# Patient Record
Sex: Male | Born: 1935 | Race: White | Hispanic: No | Marital: Single | Smoking: Former smoker
Health system: Southern US, Community
[De-identification: ages and names within clinical notes are randomized; demographics above are authoritative.]

## PROBLEM LIST (undated history)

## (undated) DIAGNOSIS — F419 Anxiety disorder, unspecified: Secondary | ICD-10-CM

## (undated) DIAGNOSIS — I10 Essential (primary) hypertension: Secondary | ICD-10-CM

## (undated) DIAGNOSIS — K219 Gastro-esophageal reflux disease without esophagitis: Secondary | ICD-10-CM

## (undated) DIAGNOSIS — I509 Heart failure, unspecified: Secondary | ICD-10-CM

## (undated) DIAGNOSIS — R0902 Hypoxemia: Secondary | ICD-10-CM

## (undated) DIAGNOSIS — F1020 Alcohol dependence, uncomplicated: Secondary | ICD-10-CM

## (undated) DIAGNOSIS — N400 Enlarged prostate without lower urinary tract symptoms: Secondary | ICD-10-CM

## (undated) DIAGNOSIS — Z950 Presence of cardiac pacemaker: Secondary | ICD-10-CM

## (undated) DIAGNOSIS — E7800 Pure hypercholesterolemia, unspecified: Secondary | ICD-10-CM

## (undated) DIAGNOSIS — F329 Major depressive disorder, single episode, unspecified: Secondary | ICD-10-CM

## (undated) DIAGNOSIS — I4430 Unspecified atrioventricular block: Secondary | ICD-10-CM

## (undated) DIAGNOSIS — F32A Depression, unspecified: Secondary | ICD-10-CM

## (undated) HISTORY — DX: Alcohol dependence, uncomplicated: F10.20

## (undated) HISTORY — PX: INSERT / REPLACE / REMOVE PACEMAKER: SUR710

## (undated) HISTORY — PX: CHOLECYSTECTOMY: SHX55

## (undated) HISTORY — PX: CATARACT EXTRACTION, BILATERAL: SHX1313

---

## 2016-11-27 ENCOUNTER — Emergency Department (HOSPITAL_COMMUNITY): Payer: Medicare Other

## 2016-11-27 ENCOUNTER — Encounter (HOSPITAL_COMMUNITY): Payer: Self-pay | Admitting: Emergency Medicine

## 2016-11-27 ENCOUNTER — Emergency Department (HOSPITAL_COMMUNITY)
Admission: EM | Admit: 2016-11-27 | Discharge: 2016-11-27 | Disposition: A | Discharge status: Home / Self Care | Payer: Medicare Other | Attending: Emergency Medicine | Admitting: Emergency Medicine

## 2016-11-27 DIAGNOSIS — W06XXXA Fall from bed, initial encounter: Secondary | ICD-10-CM | POA: Insufficient documentation

## 2016-11-27 DIAGNOSIS — Y999 Unspecified external cause status: Secondary | ICD-10-CM | POA: Diagnosis not present

## 2016-11-27 DIAGNOSIS — Z950 Presence of cardiac pacemaker: Secondary | ICD-10-CM | POA: Insufficient documentation

## 2016-11-27 DIAGNOSIS — Y929 Unspecified place or not applicable: Secondary | ICD-10-CM | POA: Diagnosis not present

## 2016-11-27 DIAGNOSIS — I110 Hypertensive heart disease with heart failure: Secondary | ICD-10-CM | POA: Diagnosis not present

## 2016-11-27 DIAGNOSIS — R0902 Hypoxemia: Secondary | ICD-10-CM | POA: Diagnosis not present

## 2016-11-27 DIAGNOSIS — Y939 Activity, unspecified: Secondary | ICD-10-CM | POA: Diagnosis not present

## 2016-11-27 DIAGNOSIS — I509 Heart failure, unspecified: Secondary | ICD-10-CM | POA: Diagnosis not present

## 2016-11-27 DIAGNOSIS — S0990XA Unspecified injury of head, initial encounter: Secondary | ICD-10-CM | POA: Diagnosis present

## 2016-11-27 DIAGNOSIS — Z87891 Personal history of nicotine dependence: Secondary | ICD-10-CM | POA: Insufficient documentation

## 2016-11-27 DIAGNOSIS — W19XXXA Unspecified fall, initial encounter: Secondary | ICD-10-CM

## 2016-11-27 HISTORY — DX: Hypoxemia: R09.02

## 2016-11-27 HISTORY — DX: Unspecified atrioventricular block: I44.30

## 2016-11-27 HISTORY — DX: Benign prostatic hyperplasia without lower urinary tract symptoms: N40.0

## 2016-11-27 HISTORY — DX: Pure hypercholesterolemia, unspecified: E78.00

## 2016-11-27 HISTORY — DX: Presence of cardiac pacemaker: Z95.0

## 2016-11-27 HISTORY — DX: Heart failure, unspecified: I50.9

## 2016-11-27 HISTORY — DX: Anxiety disorder, unspecified: F41.9

## 2016-11-27 HISTORY — DX: Gastro-esophageal reflux disease without esophagitis: K21.9

## 2016-11-27 HISTORY — DX: Essential (primary) hypertension: I10

## 2016-11-27 HISTORY — DX: Depression, unspecified: F32.A

## 2016-11-27 HISTORY — DX: Major depressive disorder, single episode, unspecified: F32.9

## 2016-11-27 NOTE — ED Triage Notes (Addendum)
Per EMS pt from [facility] for evaluation of fall. EMS stated that pt had fell while sitting on side of bed and hit back of head and c/o neck pain. EMS placed pt in towel roll for comfort. Pt history from facility is unclear due to lack of records.Pt denies any LOC when hitting head. When pt placed on room air SPO2 sats at 87%.

## 2016-11-27 NOTE — ED Notes (Signed)
Pt discharged in care of family. Family aware that he can be transported without any oxygen due to pt wishes of no further respiratory care. O2 saturation on room air 92%

## 2016-11-27 NOTE — ED Provider Notes (Signed)
[facility]-EMERGENCY DEPT Provider Note   CSN: [id number] Arrival date & time: 11/27/16  0453  LEVEL 5 CAVEAT - DEMENTIA   History   Chief Complaint Chief Complaint  Patient presents with  . Fall    HPI Christopher Maffett Sr. is a 83 y.o. male.  HPI  83 year old male brought in from his nursing home after a fall and head injury. History taken from the patient but also daughter at the bedside. She states that he has had signs of dementia and just recently moved into this nursing facility. The patient tells me that he was trying to get his covers adjusted and when he pushed back he hit his head on the bed board. However the daughter and son-in-law state that he told the facility he fell out of bed. Complaining of occipital headache/neck pain. He fell last week while walking to go get the mail and still complains of facial pain and teeth pain. He was noted to be hypoxic to 87%. He used to be on oxygen last year after his [employer] pacemaker was placed but has not required oxygen since. Does not feel short of breath at this time but has exertional dyspnea chronically. He has a living will and has told his daughter in the past that he would not want oxygen chronically anymore. No leg swelling or chest pain. He has a history of heart failure.  Past Medical History:  Diagnosis Date  . Acid reflux   . Anxiety   . AV block   . BPH (benign prostatic hyperplasia)   . CHF (congestive heart failure) (HCC)   . Depression   . High cholesterol   . Hypertension   . Hypoxia   . Pacemaker     There are no active problems to display for this patient.   Past Surgical History:  Procedure Laterality Date  . CHOLECYSTECTOMY         Home Medications    Prior to Admission medications   Not on File    Family History History reviewed. No pertinent family history.  Social History Social History  Substance Use Topics  . Smoking status: Former Games developer  . Smokeless tobacco: Never Used  . Alcohol use  No     Allergies   Amoxicillin and Doxycycline   Review of Systems Review of Systems  Unable to perform ROS: Dementia     Physical Exam Updated Vital Signs BP 120/78   Pulse 62   Temp 97.5 F (36.4 C) (Oral)   Resp (!) 24   Ht 5\' 3"  (1.6 m)   Wt 150 lb (68 kg)   SpO2 94%   BMI 26.57 kg/m   Physical Exam  Constitutional: He is oriented to person, place, and time. He appears well-developed and well-nourished.  HENT:  Head: Normocephalic and atraumatic.  Right Ear: External ear normal.  Left Ear: External ear normal.  Nose: Nose normal.  No signs of trauma, mild occipital tenderness Mild bilateral mandible tenderness  Eyes: Right eye exhibits no discharge. Left eye exhibits no discharge.  Neck: Neck supple.  Neck in towel roll  Cardiovascular: Normal rate, regular rhythm and normal heart sounds.   Pulmonary/Chest: Effort normal and breath sounds normal.  Abdominal: Soft. There is no tenderness.  Musculoskeletal: He exhibits no edema.  Neurological: He is alert and oriented to person, place, and time.  Skin: Skin is warm and dry. He is not diaphoretic.  Nursing note and vitals reviewed.    ED Treatments / Results  Labs (all  labs ordered are listed, but only abnormal results are displayed) Labs Reviewed - No data to display  EKG  EKG Interpretation  Date/Time:  Thursday November 27 2016 05:16:49 EDT Ventricular Rate:  75 PR Interval:    QRS Duration: 128 QT Interval:  435 QTC Calculation: 483 R Axis:   -79 Text Interpretation:  Atrial-paced rhythm Nonspecific IVCD with LAD Anterolateral infarct, age indeterminate No old tracing to compare Confirmed by Lynnleigh Soden MD, Shunsuke Granzow [phone]([id number]) on 11/27/2016 6:15:41 AM       Radiology Dg Chest 2 View  Result Date: 11/27/2016 CLINICAL DATA:  83 year old male with hypoxia. EXAM: CHEST  2 VIEW COMPARISON:  None. FINDINGS: There is mild cardiomegaly with central vascular prominence likely mild congestive changes. There is  shallow inspiration with bibasilar atelectatic changes. Diffuse interstitial coarsening predominantly over the left mid to lower lung field, likely chronic. Superimposed pneumonia is not excluded. Clinical correlation is recommended. Left pectoral pacemaker device noted. There is osteopenia with degenerative changes of the spine. No acute osseous pathology. IMPRESSION: 1. Mild cardiomegaly with probable mild vascular congestion. 2. Diffuse interstitial and subpleural coarsening primarily over the left mid to lower lung field. These findings are likely chronic. Developing pneumonia is however is not excluded. Clinical correlation is recommended. Electronically Signed   By: Elgie CollardArash  Radparvar M.D.   On: 11/27/2016 06:12   Ct Head Wo Contrast  Result Date: 11/27/2016 CLINICAL DATA:  83 year old male with fall and trauma to the posterior head. EXAM: CT HEAD WITHOUT CONTRAST CT MAXILLOFACIAL WITHOUT CONTRAST CT CERVICAL SPINE WITHOUT CONTRAST TECHNIQUE: Multidetector CT imaging of the head, cervical spine, and maxillofacial structures were performed using the standard protocol without intravenous contrast. Multiplanar CT image reconstructions of the cervical spine and maxillofacial structures were also generated. COMPARISON:  None. FINDINGS: CT HEAD FINDINGS Brain: There is moderate age-related atrophy and chronic microvascular ischemic changes. There is no acute intracranial hemorrhage. No mass effect or midline shift noted. No intra-axial fluid collection. Vascular: No hyperdense vessel or unexpected calcification. Skull: Normal. Negative for fracture or focal lesion. Other: None. CT MAXILLOFACIAL FINDINGS Osseous: No fracture or mandibular dislocation. No destructive process. Orbits: The globes and retro-orbital fat are preserved. Bilateral cataract surgeries noted. Sinuses: Clear. Soft tissues: Negative. CT CERVICAL SPINE FINDINGS Alignment: No acute subluxation. Skull base and vertebrae: No acute fracture. No  primary bone lesion or focal pathologic process. Soft tissues and spinal canal: No prevertebral fluid or swelling. No visible canal hematoma. Disc levels: No acute findings. Mild degenerative disc disease. Multilevel facet hypertrophy most prominent at C6-C7 on the left. Upper chest: Paraseptal and centrilobular emphysema with a 5 cm right apical subpleural bleb. Other: Cardiac pacemaker wires partially visualized. IMPRESSION: 1. No acute intracranial hemorrhage. Moderate age-related atrophy and chronic microvascular ischemic changes. 2. No acute/traumatic cervical spine pathology . 3. No acute facial bone fractures. Electronically Signed   By: Elgie CollardArash  Radparvar M.D.   On: 11/27/2016 06:06   Ct Cervical Spine Wo Contrast  Result Date: 11/27/2016 CLINICAL DATA:  83 year old male with fall and trauma to the posterior head. EXAM: CT HEAD WITHOUT CONTRAST CT MAXILLOFACIAL WITHOUT CONTRAST CT CERVICAL SPINE WITHOUT CONTRAST TECHNIQUE: Multidetector CT imaging of the head, cervical spine, and maxillofacial structures were performed using the standard protocol without intravenous contrast. Multiplanar CT image reconstructions of the cervical spine and maxillofacial structures were also generated. COMPARISON:  None. FINDINGS: CT HEAD FINDINGS Brain: There is moderate age-related atrophy and chronic microvascular ischemic changes. There is no acute intracranial hemorrhage. No mass  effect or midline shift noted. No intra-axial fluid collection. Vascular: No hyperdense vessel or unexpected calcification. Skull: Normal. Negative for fracture or focal lesion. Other: None. CT MAXILLOFACIAL FINDINGS Osseous: No fracture or mandibular dislocation. No destructive process. Orbits: The globes and retro-orbital fat are preserved. Bilateral cataract surgeries noted. Sinuses: Clear. Soft tissues: Negative. CT CERVICAL SPINE FINDINGS Alignment: No acute subluxation. Skull base and vertebrae: No acute fracture. No primary bone lesion or  focal pathologic process. Soft tissues and spinal canal: No prevertebral fluid or swelling. No visible canal hematoma. Disc levels: No acute findings. Mild degenerative disc disease. Multilevel facet hypertrophy most prominent at C6-C7 on the left. Upper chest: Paraseptal and centrilobular emphysema with a 5 cm right apical subpleural bleb. Other: Cardiac pacemaker wires partially visualized. IMPRESSION: 1. No acute intracranial hemorrhage. Moderate age-related atrophy and chronic microvascular ischemic changes. 2. No acute/traumatic cervical spine pathology . 3. No acute facial bone fractures. Electronically Signed   By: Elgie Collard M.D.   On: 11/27/2016 06:06   Ct Maxillofacial Wo Contrast  Result Date: 11/27/2016 CLINICAL DATA:  83 year old male with fall and trauma to the posterior head. EXAM: CT HEAD WITHOUT CONTRAST CT MAXILLOFACIAL WITHOUT CONTRAST CT CERVICAL SPINE WITHOUT CONTRAST TECHNIQUE: Multidetector CT imaging of the head, cervical spine, and maxillofacial structures were performed using the standard protocol without intravenous contrast. Multiplanar CT image reconstructions of the cervical spine and maxillofacial structures were also generated. COMPARISON:  None. FINDINGS: CT HEAD FINDINGS Brain: There is moderate age-related atrophy and chronic microvascular ischemic changes. There is no acute intracranial hemorrhage. No mass effect or midline shift noted. No intra-axial fluid collection. Vascular: No hyperdense vessel or unexpected calcification. Skull: Normal. Negative for fracture or focal lesion. Other: None. CT MAXILLOFACIAL FINDINGS Osseous: No fracture or mandibular dislocation. No destructive process. Orbits: The globes and retro-orbital fat are preserved. Bilateral cataract surgeries noted. Sinuses: Clear. Soft tissues: Negative. CT CERVICAL SPINE FINDINGS Alignment: No acute subluxation. Skull base and vertebrae: No acute fracture. No primary bone lesion or focal pathologic  process. Soft tissues and spinal canal: No prevertebral fluid or swelling. No visible canal hematoma. Disc levels: No acute findings. Mild degenerative disc disease. Multilevel facet hypertrophy most prominent at C6-C7 on the left. Upper chest: Paraseptal and centrilobular emphysema with a 5 cm right apical subpleural bleb. Other: Cardiac pacemaker wires partially visualized. IMPRESSION: 1. No acute intracranial hemorrhage. Moderate age-related atrophy and chronic microvascular ischemic changes. 2. No acute/traumatic cervical spine pathology . 3. No acute facial bone fractures. Electronically Signed   By: Elgie Collard M.D.   On: 11/27/2016 06:06    Procedures Procedures (including critical care time)  Medications Ordered in ED Medications - No data to display   Initial Impression / Assessment and Plan / ED Course  I have reviewed the triage vital signs and the nursing notes.  Pertinent labs & imaging results that were available during my care of the patient were reviewed by me and considered in my medical decision making (see chart for details).  Clinical Course as of Nov 28 731  Thu Nov 27, 2016  0533 CT head, c-spine, face given pain/injuries CXR given hypoxia (87% at lowest). Does not appear obviously fluid overloaded. Patient does not want chronic O2. Daughter (POA) wants to hold on bloodwork as she feels it wouldn't change management.   [SG]    Clinical Course User Index [SG] Pricilla Loveless, MD    Patient's workup shows no significant injury. CXR with mild edema vs chronic findings.  I think his mild hypoxia is likely subacute/chronic, especially since he used to be on O2. I discussed need for workup and Rx for O2 but patient and daughter (POA) decline. They understand risk of hypoxia such as organ dysfunction and death. While he has early dementia he has been adamant about this for some time and daughter agrees to this too. No events on eval of pacemaker. They decline bloodwork. D/c  home with return precautions, PCP f/u.  Final Clinical Impressions(s) / ED Diagnoses   Final diagnoses:  Fall, initial encounter  Minor head injury, initial encounter  Hypoxia    New Prescriptions There are no discharge medications for this patient.    Pricilla Loveless, MD 11/27/16 [phone]

## 2016-11-27 NOTE — ED Notes (Signed)
Bed: WA17 Expected date:  Expected time:  Means of arrival:  Comments: EMS 83 yo male fall/neck pain/low pulse ox-O2

## 2016-11-27 NOTE — ED Notes (Signed)
Implanted pacemaker iterrigated and awaiting results from [employer].

## 2017-03-01 ENCOUNTER — Emergency Department (HOSPITAL_COMMUNITY): Payer: Medicare Other

## 2017-03-01 ENCOUNTER — Encounter (HOSPITAL_COMMUNITY): Payer: Self-pay | Admitting: Emergency Medicine

## 2017-03-01 ENCOUNTER — Emergency Department (HOSPITAL_COMMUNITY)
Admission: EM | Admit: 2017-03-01 | Discharge: 2017-03-01 | Disposition: A | Discharge status: Home / Self Care | Payer: Medicare Other | Attending: Emergency Medicine | Admitting: Emergency Medicine

## 2017-03-01 DIAGNOSIS — Z950 Presence of cardiac pacemaker: Secondary | ICD-10-CM | POA: Insufficient documentation

## 2017-03-01 DIAGNOSIS — Y9289 Other specified places as the place of occurrence of the external cause: Secondary | ICD-10-CM | POA: Diagnosis not present

## 2017-03-01 DIAGNOSIS — S161XXA Strain of muscle, fascia and tendon at neck level, initial encounter: Secondary | ICD-10-CM | POA: Diagnosis not present

## 2017-03-01 DIAGNOSIS — S0990XA Unspecified injury of head, initial encounter: Secondary | ICD-10-CM | POA: Diagnosis not present

## 2017-03-01 DIAGNOSIS — I509 Heart failure, unspecified: Secondary | ICD-10-CM | POA: Diagnosis not present

## 2017-03-01 DIAGNOSIS — Z87891 Personal history of nicotine dependence: Secondary | ICD-10-CM | POA: Diagnosis not present

## 2017-03-01 DIAGNOSIS — M542 Cervicalgia: Secondary | ICD-10-CM | POA: Diagnosis not present

## 2017-03-01 DIAGNOSIS — Y999 Unspecified external cause status: Secondary | ICD-10-CM | POA: Diagnosis not present

## 2017-03-01 DIAGNOSIS — Y9384 Activity, sleeping: Secondary | ICD-10-CM | POA: Insufficient documentation

## 2017-03-01 DIAGNOSIS — W06XXXA Fall from bed, initial encounter: Secondary | ICD-10-CM | POA: Insufficient documentation

## 2017-03-01 DIAGNOSIS — Y9389 Activity, other specified: Secondary | ICD-10-CM | POA: Diagnosis not present

## 2017-03-01 DIAGNOSIS — I110 Hypertensive heart disease with heart failure: Secondary | ICD-10-CM | POA: Insufficient documentation

## 2017-03-01 DIAGNOSIS — S199XXA Unspecified injury of neck, initial encounter: Secondary | ICD-10-CM | POA: Diagnosis present

## 2017-03-01 MED ORDER — ACETAMINOPHEN 325 MG PO TABS: 650.0000 mg | ORAL_TABLET | Freq: Four times a day (QID) | ORAL | 1 refills | Status: DC | PRN

## 2017-03-01 MED ORDER — ACETAMINOPHEN 325 MG PO TABS: 650.0000 mg | ORAL_TABLET | Freq: Once | ORAL | Status: DC

## 2017-03-01 MED ORDER — ACETAMINOPHEN 325 MG PO TABS
650.0000 mg | ORAL_TABLET | Freq: Once | ORAL | Status: AC
  Administered 2017-03-01: 650 mg via ORAL
  Filled 2017-03-01: qty 2

## 2017-03-01 NOTE — ED Notes (Signed)
Patient transported to CT 

## 2017-03-01 NOTE — ED Notes (Signed)
Report called to nurse at [facility]

## 2017-03-01 NOTE — ED Notes (Signed)
Bed: [id number] Expected date:  Expected time:  Means of arrival:  Comments: EMS 83 yo male from SNF-rolled out of bed and hit head-hematoma right parietal area-leg and arm pain

## 2017-03-01 NOTE — Discharge Instructions (Signed)
We saw you in the ER after you had a fall. °All the imaging results are normal, no fractures seen. No evidence of brain bleed. °Please be very careful with walking, and do everything possible to prevent falls. ° ° °

## 2017-03-01 NOTE — ED Provider Notes (Signed)
Hand-off from Dr. Rhunette CroftNanavati. Dispo pending xrays.   See initial provider's note for full HPI.   Briefly patient is an 83 year old male with history of hypertension, hyperlipidemia, CHF, pacemaker who presents to the ED via EMS from [facility] in [place] for falling out of bed. Patient reports he was sleeping and rolled out of bed hitting his head on the bed rail. Reports pain to right shoulder, head and leg. Denies use of anticoagulants.  Exam performed by initial provider revealed midline C-spine tenderness. Patient with no hematoma, bleeding of scalp or facial abrasion. Remaining exam unremarkable. CT head and cervical spine with no acute injury. On reevaluation prior to discharge patient began complaining of pain to his lower back and tailbone. Plan to order x-rays of lumbar spine and sacrum for further evaluation.  X-rays of lumbar spine and sacrum negative. VSS. Patient has remained hemodynamically stable on the ED. Discussed results and plan for discharge with patient. Patient discharged home with prescription of Tylenol and discussed symptomatic treatment. Discussed return precautions.   Barrett Henleadeau, Breezie Micucci Elizabeth, PA-C 03/01/17 [id number]    Charlynne PanderYao, David Hsienta, MD 03/02/17 [phone]

## 2017-03-01 NOTE — ED Notes (Signed)
PTAR at bedside 

## 2017-03-01 NOTE — ED Provider Notes (Addendum)
[facility]-EMERGENCY DEPT Provider Note   CSN: [id number] Arrival date & time: 03/01/17  0543     History   Chief Complaint Chief Complaint  Patient presents with  . Fall    HPI Christopher Nemetz Sr. is a 83 y.o. male.  HPI PT comes in with cc of fall. PT reports that he rolled out of his bed and fell onto the floor.  Pt has pain over the shoulder, head, and the legs - all right sided. Pt is moving all 4 extremities. Pt has hx of CHF, depression hypoxia, HTN. He is not on coumadin.    Past Medical History:  Diagnosis Date  . Acid reflux   . Anxiety   . AV block   . BPH (benign prostatic hyperplasia)   . CHF (congestive heart failure) (HCC)   . Depression   . High cholesterol   . Hypertension   . Hypoxia   . Pacemaker     There are no active problems to display for this patient.   Past Surgical History:  Procedure Laterality Date  . CHOLECYSTECTOMY        Home Medications    Prior to Admission medications   Not on File    Family History History reviewed. No pertinent family history.  Social History Social History  Substance Use Topics  . Smoking status: Former Games developer  . Smokeless tobacco: Never Used  . Alcohol use No     Allergies   Amoxicillin and Doxycycline   Review of Systems Review of Systems  All other systems reviewed and are negative.  Physical Exam Updated Vital Signs BP 136/86   Pulse 76   Temp 98.3 F (36.8 C) (Oral)   Resp 19   SpO2 97%   Physical Exam  Constitutional: He is oriented to person, place, and time. He appears well-developed.  HENT:  Head: Atraumatic.  Neck: Neck supple.  Pt has midline cspine tenderness  Cardiovascular: Normal rate.   Pulmonary/Chest: Effort normal.  Musculoskeletal:  Head to toe evaluation shows no hematoma, bleeding of the scalp, no facial abrasions, no spine step offs, crepitus of the chest or neck, no tenderness to palpation of the bilateral upper and lower extremities, no gross  deformities, no chest tenderness, no pelvic pain.   Neurological: He is alert and oriented to person, place, and time.  Skin: Skin is warm.  Nursing note and vitals reviewed.    ED Treatments / Results  Labs (all labs ordered are listed, but only abnormal results are displayed) Labs Reviewed - No data to display  EKG  EKG Interpretation None       Radiology Ct Head Wo Contrast  Result Date: 03/01/2017 CLINICAL DATA:  Rolled out of bed, struck head on railing. History of hypertension. EXAM: CT HEAD WITHOUT CONTRAST CT CERVICAL SPINE WITHOUT CONTRAST TECHNIQUE: Multidetector CT imaging of the head and cervical spine was performed following the standard protocol without intravenous contrast. Multiplanar CT image reconstructions of the cervical spine were also generated. COMPARISON:  CT HEAD and cervical spine November 27, 2016 FINDINGS: CT HEAD FINDINGS BRAIN: No intraparenchymal hemorrhage, mass effect nor midline shift. The ventricles and sulci are normal for age. Confluent supratentorial white matter. No acute large vascular territory infarcts. No abnormal extra-axial fluid collections. Basal cisterns are patent. VASCULAR: Mild calcific atherosclerosis of the carotid siphons. SKULL: No skull fracture. Small RIGHT frontal scalp hematoma without subcutaneous gas or radiopaque foreign bodies. SINUSES/ORBITS: The mastoid air-cells and included paranasal sinuses are well-aerated.The included ocular globes  and orbital contents are non-suspicious. Status post bilateral ocular lens implants. OTHER: None. CT CERVICAL SPINE FINDINGS ALIGNMENT: Maintained lordosis. Vertebral bodies in alignment. SKULL BASE AND VERTEBRAE: Cervical vertebral bodies and posterior elements are intact. Intervertebral disc heights preserved. No destructive bony lesions. C1-2 articulation maintained. Multilevel moderate facet arthropathy. SOFT TISSUES AND SPINAL CANAL: Nonacute. Mild calcific atherosclerosis of the carotid  bifurcations. DISC LEVELS: No significant osseous canal stenosis. Moderate C4-5, C5-6 moderate LEFT C6-7 neural foraminal narrowing. UPPER CHEST: RIGHT apical bullous changes with suspected emphysema/fibrosis. OTHER: None. IMPRESSION: CT HEAD: No acute intracranial process. Small RIGHT frontal scalp hematoma without skull fracture. Stable examination including moderate to severe chronic small vessel ischemic disease. CT CERVICAL SPINE: No acute fracture or malalignment. Electronically Signed   By: Awilda Metroourtnay  Bloomer M.D.   On: 03/01/2017 06:55   Ct Cervical Spine Wo Contrast  Result Date: 03/01/2017 CLINICAL DATA:  Rolled out of bed, struck head on railing. History of hypertension. EXAM: CT HEAD WITHOUT CONTRAST CT CERVICAL SPINE WITHOUT CONTRAST TECHNIQUE: Multidetector CT imaging of the head and cervical spine was performed following the standard protocol without intravenous contrast. Multiplanar CT image reconstructions of the cervical spine were also generated. COMPARISON:  CT HEAD and cervical spine November 27, 2016 FINDINGS: CT HEAD FINDINGS BRAIN: No intraparenchymal hemorrhage, mass effect nor midline shift. The ventricles and sulci are normal for age. Confluent supratentorial white matter. No acute large vascular territory infarcts. No abnormal extra-axial fluid collections. Basal cisterns are patent. VASCULAR: Mild calcific atherosclerosis of the carotid siphons. SKULL: No skull fracture. Small RIGHT frontal scalp hematoma without subcutaneous gas or radiopaque foreign bodies. SINUSES/ORBITS: The mastoid air-cells and included paranasal sinuses are well-aerated.The included ocular globes and orbital contents are non-suspicious. Status post bilateral ocular lens implants. OTHER: None. CT CERVICAL SPINE FINDINGS ALIGNMENT: Maintained lordosis. Vertebral bodies in alignment. SKULL BASE AND VERTEBRAE: Cervical vertebral bodies and posterior elements are intact. Intervertebral disc heights preserved. No  destructive bony lesions. C1-2 articulation maintained. Multilevel moderate facet arthropathy. SOFT TISSUES AND SPINAL CANAL: Nonacute. Mild calcific atherosclerosis of the carotid bifurcations. DISC LEVELS: No significant osseous canal stenosis. Moderate C4-5, C5-6 moderate LEFT C6-7 neural foraminal narrowing. UPPER CHEST: RIGHT apical bullous changes with suspected emphysema/fibrosis. OTHER: None. IMPRESSION: CT HEAD: No acute intracranial process. Small RIGHT frontal scalp hematoma without skull fracture. Stable examination including moderate to severe chronic small vessel ischemic disease. CT CERVICAL SPINE: No acute fracture or malalignment. Electronically Signed   By: Awilda Metroourtnay  Bloomer M.D.   On: 03/01/2017 06:55    Procedures Procedures (including critical care time)  Medications Ordered in ED Medications  acetaminophen (TYLENOL) tablet 650 mg (650 mg Oral Given 03/01/17 0716)     Initial Impression / Assessment and Plan / ED Course  I have reviewed the triage vital signs and the nursing notes.  Pertinent labs & imaging results that were available during my care of the patient were reviewed by me and considered in my medical decision making (see chart for details).     DDx includes: - Mechanical falls - ICH - Fractures - Contusions - Soft tissue injury  Pt comes in after a fall. He rolled out of the bed and he has a hematoma to the R side of the scalp and also has some arm pain and hip pain. However, he is moving all 4 extremities. PT is having headache and neck pain - CTs ordered.   Final Clinical Impressions(s) / ED Diagnoses   Final diagnoses:  Acute strain of  neck muscle, initial encounter    New Prescriptions New Prescriptions   No medications on file     Derwood Kaplan, MD 03/01/17 2956    Derwood Kaplan, MD 03/01/17 [phone]

## 2017-03-01 NOTE — ED Triage Notes (Signed)
Pt BIB EMS from [facility] in [place] for a fall out of bed. Pt was sleeping and rolled out of bed, hitting his head on the bed railing. Hematoma noted to side of head. Pt also complains of right sided arm and leg pain. No blood thinners. Possible history of dementia, but patient is alert and oriented.

## 2017-09-10 ENCOUNTER — Encounter: Payer: Self-pay | Admitting: Allergy & Immunology

## 2017-09-10 ENCOUNTER — Ambulatory Visit (INDEPENDENT_AMBULATORY_CARE_PROVIDER_SITE_OTHER): Payer: Medicare Other | Admitting: Allergy & Immunology

## 2017-09-10 VITALS — BP 110/72 | HR 78 | Resp 16 | Ht 66.0 in | Wt 186.0 lb

## 2017-09-10 DIAGNOSIS — J310 Chronic rhinitis: Secondary | ICD-10-CM | POA: Insufficient documentation

## 2017-09-10 MED ORDER — CETIRIZINE HCL 10 MG PO TABS: 10.0000 mg | ORAL_TABLET | Freq: Every day | ORAL | 5 refills | Status: AC

## 2017-09-10 MED ORDER — IPRATROPIUM BROMIDE 0.03 % NA SOLN: 2.0000 | Freq: Four times a day (QID) | NASAL | 5 refills | Status: AC | PRN

## 2017-09-10 MED ORDER — FLUTICASONE PROPIONATE 50 MCG/ACT NA SUSP: 2.0000 | Freq: Every day | NASAL | 5 refills | Status: AC

## 2017-09-10 MED ORDER — MONTELUKAST SODIUM 10 MG PO TABS: 10.0000 mg | ORAL_TABLET | Freq: Every day | ORAL | 5 refills | Status: AC
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
## 2017-09-10 NOTE — Patient Instructions (Addendum)
1. Chronic rhinitis - We will get some lab work to look for allergies. - We will also get your records from [facility]. - Continue with fluticasone one spray per nostril daily. - Continue with cetirizine 10mg  daily - Continue with montelukast 10mg  daily. - Add on nasal ipratropium one spray per nostril every 6 hours as needed for runny nose. - We can consider starting allergy shots back again if symptoms are not better at the next visit.   2. Return in about 3 months (around 12/09/2017).   Please inform us of any Emergency Department visits, hospitalizations, or changes in symptoms. Call us before going to the ED for breathing or allergy symptoms since we might be able to fit you in for a sick visit. Feel free to contact us anytime with any questions, problems, or concerns.  It was a pleasure to meet you today! Happy New Year!   Websites that have reliable patient information: 1. American Academy of Asthma, Allergy, and Immunology: www.aaaai.org 2. Food Allergy Research and Education (FARE): foodallergy.org 3. Mothers of Asthmatics: http://www.asthmacommunitynetwork.org 4. American College of Allergy, Asthma, and Immunology: www.acaai.org

## 2017-09-17 LAB — IGE+ALLERGENS ZONE 2(30)
Bahia Grass IgE: <0.1 kU/L
Cockroach, American IgE: <0.1 kU/L
D Farinae IgE: <0.1 kU/L
D Pteronyssinus IgE: <0.1 kU/L
Dog Dander IgE: <0.1 kU/L
Elm, American IgE: <0.1 kU/L
IGE (IMMUNOGLOBULIN E), SERUM: 4 [IU]/mL (ref 0–100)
Johnson Grass IgE: <0.1 kU/L
Oak, White IgE: <0.1 kU/L

## 2017-10-07 ENCOUNTER — Other Ambulatory Visit: Payer: Self-pay

## 2017-10-07 ENCOUNTER — Encounter (HOSPITAL_COMMUNITY): Payer: Self-pay | Admitting: Emergency Medicine

## 2017-10-07 ENCOUNTER — Emergency Department (HOSPITAL_COMMUNITY)
Admission: EM | Admit: 2017-10-07 | Discharge: 2017-10-07 | Disposition: A | Discharge status: Home / Self Care | Payer: Medicare Other | Attending: Emergency Medicine | Admitting: Emergency Medicine

## 2017-10-07 DIAGNOSIS — F419 Anxiety disorder, unspecified: Secondary | ICD-10-CM | POA: Diagnosis not present

## 2017-10-07 DIAGNOSIS — I509 Heart failure, unspecified: Secondary | ICD-10-CM | POA: Insufficient documentation

## 2017-10-07 DIAGNOSIS — Z79899 Other long term (current) drug therapy: Secondary | ICD-10-CM | POA: Insufficient documentation

## 2017-10-07 DIAGNOSIS — M545 Low back pain: Secondary | ICD-10-CM | POA: Insufficient documentation

## 2017-10-07 DIAGNOSIS — I10 Essential (primary) hypertension: Secondary | ICD-10-CM | POA: Insufficient documentation

## 2017-10-07 DIAGNOSIS — R0981 Nasal congestion: Secondary | ICD-10-CM | POA: Diagnosis not present

## 2017-10-07 DIAGNOSIS — Z95811 Presence of heart assist device: Secondary | ICD-10-CM | POA: Insufficient documentation

## 2017-10-07 DIAGNOSIS — Z87891 Personal history of nicotine dependence: Secondary | ICD-10-CM | POA: Insufficient documentation

## 2017-10-07 DIAGNOSIS — R350 Frequency of micturition: Secondary | ICD-10-CM | POA: Diagnosis not present

## 2017-10-07 LAB — URINALYSIS, ROUTINE W REFLEX MICROSCOPIC
Bilirubin Urine: NEGATIVE
Glucose, UA: NEGATIVE mg/dL
Hgb urine dipstick: NEGATIVE
KETONES UR: 5 mg/dL — AB
LEUKOCYTES UA: NEGATIVE
Nitrite: NEGATIVE
PROTEIN: NEGATIVE mg/dL
Specific Gravity, Urine: 1.023 (ref 1.005–1.030)
pH: 6 (ref 5.0–8.0)
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
## 2017-10-07 NOTE — ED Notes (Signed)
Rechecked O2 pt still at 93% on room air.

## 2017-10-07 NOTE — ED Notes (Signed)
Called [facility] for transport.  

## 2017-10-07 NOTE — ED Notes (Signed)
[facility] arrived to receive patient.  

## 2017-10-07 NOTE — ED Notes (Signed)
Called facility to give report.  Stated they did not have a nurse there and I would have to give report to the medtech.  Report given.

## 2017-10-07 NOTE — Discharge Instructions (Signed)
Mr. Christopher Kelley was evaluated for his chronic congestion, low back pain, and anxious feelings today.  On evaluation in the emergency department, he was calm without any evidence of agitation or anxiety.    Patient reported to us that he was unsure why he was visiting the emergency department.  We repeated a urinalysis today after his treatment for urinary tract infection, which shows no evidence of infection.  Patient deemed stable for discharge, and he was in agreement with this plan of care.  Thank you for allowing us to participate in Mr. Christopher Kelley's care.

## 2017-10-07 NOTE — ED Notes (Signed)
Pt ambulatory to restroom without assistance 

## 2017-10-08 LAB — URINE CULTURE: Culture: <10000 — AB

## 2017-11-12 ENCOUNTER — Encounter: Payer: Self-pay | Admitting: Family Medicine

## 2017-11-12 ENCOUNTER — Ambulatory Visit (INDEPENDENT_AMBULATORY_CARE_PROVIDER_SITE_OTHER): Payer: Medicare Other | Admitting: Family Medicine

## 2017-11-12 VITALS — BP 124/76 | HR 84 | Resp 16

## 2017-11-12 DIAGNOSIS — J310 Chronic rhinitis: Secondary | ICD-10-CM

## 2017-11-12 MED ORDER — AZELASTINE HCL 0.1 % NA SOLN: 2.0000 | Freq: Two times a day (BID) | NASAL | 5 refills | Status: DC

## 2017-11-12 NOTE — Patient Instructions (Addendum)
1. Chronic rhinitis - Skin prick testing today was negative to all environmental allergens. We then moved on to stronger allergen testing with intradermal skin testing which was slightly positive to only one indoor molds. At this point, I would suggest Christopher Kelley continue on his previous therapy. He may add Astelin nasal spray 2 sprays per nostril twice a day as needed,  Mucinex 600 mg every 12 hours as needed if he continues to experience thick mucus and increase fluid intake as possible.  - Continue with fluticasone one spray per nostril daily. - Continue with cetirizine 10mg  daily - Continue with montelukast 10mg  daily. - Continue nasal ipratropium one spray per nostril every 6 hours as needed for runny nose. [facility]work [facility]   2. Follow up in 6 months or sooner if needed  Please inform us of any Emergency Department visits, hospitalizations, or changes in symptoms. Call us before going to the ED for breathing or allergy symptoms since we might be able to fit you in for a sick visit. Feel free to contact us anytime with any questions, problems, or concerns.  It was a pleasure to meet you today!   Websites that have reliable patient information: 1. American Academy of Asthma, Allergy, and Immunology: www.aaaai.org 2. Food Allergy Research and Education (FARE): foodallergy.org 3. Mothers of Asthmatics: http://www.asthmacommunitynetwork.org 4. American College of Allergy, Asthma, and Immunology: www.acaai.org

## 2017-11-12 NOTE — Progress Notes (Signed)
[address] [place] [id number] Dept: [phone]  FOLLOW UP NOTE  Patient ID: Christopher FreestoneJoseph Langelier Sr., male    DOB: [birth year]  Age: 84 y.o. MRN: [id number] Date of Office Visit: 11/12/2017  Assessment  Chief Complaint: Allergy Testing  HPI Christopher FreestoneJoseph Wadel Sr. is an 84 year old male who returns to the clinic for follow up skin testing. He was last seen in this clinic on 09/10/2017 by Dr. Dellis AnesGallagher for evaluation of chronic rhinitis. At that visit, he had a zone 2 blood draw to evaluate environmental allergens which was negative. He was continued on his fluticasone nasal spray, cetirizine 10 mg once a day, montelukast 10 mg once a day, and added Atrovent nasal spray 1 spray every 6 hours as needed for a runny nose.   At today's visit, he reports he is feeling well and ready for allergy skin testing. He is reporting alternating thin and thick nasal drainage, occasional non-productive cough, and itchy watery eyes. He reports there is no seasonal variation in the aforementioned symptoms. Christopher Kelley does not have any concerns for food allergies at this time. He currently takes montelukast 10 mg and zyrtec 10 mg as needed for allergy control.    Drug Allergies:  Allergies  Allergen Reactions  . Amoxicillin Hives  . Doxycycline Rash    Physical Exam: BP 124/76   Pulse 84   Resp 16    Physical Exam  Constitutional: He appears well-developed and well-nourished.  HENT:  Right Ear: External ear normal.  Left Ear: External ear normal.  Mouth/Throat: Oropharynx is clear and moist.  Bilateral nares slightly erythematous and edematous with clear nasal drainage noted.   Eyes: Conjunctivae are normal.  Neck: Normal range of motion. Neck supple.  Cardiovascular: Normal rate, regular rhythm and normal heart sounds.  S1S2 normal. Regular heart rate and rhythm. No murmur noted. Pacemaker remains in place.   Pulmonary/Chest: Effort normal and breath sounds normal.  Lungs clear to auscultation    Musculoskeletal: Normal range of motion.  Neurological: He is alert.  Skin: Skin is warm and dry.  Psychiatric: He has a normal mood and affect. His behavior is normal.    Diagnostics: Skin prick test to environmental panel was negative with a positive control. Intradermal skin testing was slightly positive to major mold mix.    Assessment and Plan: 1. Chronic rhinitis     Meds ordered this encounter  Medications  . azelastine (ASTELIN) 0.1 % nasal spray    Sig: Place 2 sprays into both nostrils 2 (two) times daily. Use in each nostril as directed    Dispense:  30 mL    Refill:  5    Patient Instructions  1. Chronic rhinitis - Skin prick testing today was negative to all environmental allergens. We then moved on to stronger allergen testing with intradermal skin testing which was slightly positive to only one indoor molds. At this point, I would suggest Christopher Kelley continue on his previous therapy. He may add Astelin nasal spray 2 sprays per nostril twice a day as needed,  Mucinex 600 mg every 12 hours as needed if he continues to experience thick mucus and increase fluid intake as possible.  - Continue with fluticasone one spray per nostril daily. - Continue with cetirizine 10mg  daily - Continue with montelukast 10mg  daily. - Continue nasal ipratropium one spray per nostril every 6 hours as needed for runny nose.   2. Follow up in 6 months or sooner if needed  Please inform  us of any Emergency Department visits, hospitalizations, or changes in symptoms. Call us before going to the ED for breathing or allergy symptoms since we might be able to fit you in for a sick visit. Feel free to contact us anytime with any questions, problems, or concerns.  It was a pleasure to meet you today!   Websites that have reliable patient information: 1. American Academy of Asthma, Allergy, and Immunology: www.aaaai.org 2. Food Allergy Research and Education (FARE): foodallergy.org 3. Mothers of  Asthmatics: http://www.asthmacommunitynetwork.org 4. American College of Allergy, Asthma, and Immunology: www.acaai.org      Return in about 6 months (around 05/15/2018), or if symptoms worsen or fail to improve.    Thank you for the opportunity to care for this patient.  Please do not hesitate to contact me with questions.  Thermon Leyland, FNP [facility]

## 2017-12-21 ENCOUNTER — Observation Stay (HOSPITAL_COMMUNITY): Payer: Medicare Other

## 2017-12-21 ENCOUNTER — Observation Stay (HOSPITAL_COMMUNITY)
Admission: EM | Admit: 2017-12-21 | Discharge: 2017-12-22 | Disposition: A | Discharge status: Skilled Nursing Facility | Payer: Medicare Other | Attending: Internal Medicine | Admitting: Internal Medicine

## 2017-12-21 ENCOUNTER — Emergency Department (HOSPITAL_COMMUNITY): Payer: Medicare Other

## 2017-12-21 ENCOUNTER — Other Ambulatory Visit: Payer: Self-pay

## 2017-12-21 ENCOUNTER — Encounter (HOSPITAL_COMMUNITY): Payer: Self-pay | Admitting: Nurse Practitioner

## 2017-12-21 DIAGNOSIS — N400 Enlarged prostate without lower urinary tract symptoms: Secondary | ICD-10-CM | POA: Insufficient documentation

## 2017-12-21 DIAGNOSIS — J439 Emphysema, unspecified: Secondary | ICD-10-CM | POA: Insufficient documentation

## 2017-12-21 DIAGNOSIS — R45851 Suicidal ideations: Secondary | ICD-10-CM | POA: Insufficient documentation

## 2017-12-21 DIAGNOSIS — E785 Hyperlipidemia, unspecified: Secondary | ICD-10-CM | POA: Insufficient documentation

## 2017-12-21 DIAGNOSIS — N3281 Overactive bladder: Secondary | ICD-10-CM | POA: Insufficient documentation

## 2017-12-21 DIAGNOSIS — J9611 Chronic respiratory failure with hypoxia: Secondary | ICD-10-CM | POA: Diagnosis present

## 2017-12-21 DIAGNOSIS — Z881 Allergy status to other antibiotic agents status: Secondary | ICD-10-CM | POA: Insufficient documentation

## 2017-12-21 DIAGNOSIS — Z9049 Acquired absence of other specified parts of digestive tract: Secondary | ICD-10-CM | POA: Diagnosis not present

## 2017-12-21 DIAGNOSIS — I4581 Long QT syndrome: Secondary | ICD-10-CM | POA: Insufficient documentation

## 2017-12-21 DIAGNOSIS — J8410 Pulmonary fibrosis, unspecified: Secondary | ICD-10-CM | POA: Diagnosis not present

## 2017-12-21 DIAGNOSIS — Z79899 Other long term (current) drug therapy: Secondary | ICD-10-CM | POA: Insufficient documentation

## 2017-12-21 DIAGNOSIS — W010XXA Fall on same level from slipping, tripping and stumbling without subsequent striking against object, initial encounter: Secondary | ICD-10-CM | POA: Insufficient documentation

## 2017-12-21 DIAGNOSIS — J9601 Acute respiratory failure with hypoxia: Principal | ICD-10-CM | POA: Insufficient documentation

## 2017-12-21 DIAGNOSIS — J9811 Atelectasis: Secondary | ICD-10-CM | POA: Insufficient documentation

## 2017-12-21 DIAGNOSIS — F0390 Unspecified dementia without behavioral disturbance: Secondary | ICD-10-CM | POA: Diagnosis not present

## 2017-12-21 DIAGNOSIS — E7800 Pure hypercholesterolemia, unspecified: Secondary | ICD-10-CM | POA: Insufficient documentation

## 2017-12-21 DIAGNOSIS — Z87891 Personal history of nicotine dependence: Secondary | ICD-10-CM | POA: Insufficient documentation

## 2017-12-21 DIAGNOSIS — I4430 Unspecified atrioventricular block: Secondary | ICD-10-CM | POA: Insufficient documentation

## 2017-12-21 DIAGNOSIS — Z880 Allergy status to penicillin: Secondary | ICD-10-CM | POA: Diagnosis not present

## 2017-12-21 DIAGNOSIS — R0902 Hypoxemia: Secondary | ICD-10-CM | POA: Diagnosis not present

## 2017-12-21 DIAGNOSIS — F419 Anxiety disorder, unspecified: Secondary | ICD-10-CM | POA: Diagnosis not present

## 2017-12-21 DIAGNOSIS — I110 Hypertensive heart disease with heart failure: Secondary | ICD-10-CM | POA: Insufficient documentation

## 2017-12-21 DIAGNOSIS — Z66 Do not resuscitate: Secondary | ICD-10-CM | POA: Diagnosis not present

## 2017-12-21 DIAGNOSIS — Z7982 Long term (current) use of aspirin: Secondary | ICD-10-CM | POA: Insufficient documentation

## 2017-12-21 DIAGNOSIS — W19XXXA Unspecified fall, initial encounter: Secondary | ICD-10-CM

## 2017-12-21 DIAGNOSIS — I700 Atherosclerosis of aorta: Secondary | ICD-10-CM | POA: Diagnosis not present

## 2017-12-21 DIAGNOSIS — N3289 Other specified disorders of bladder: Secondary | ICD-10-CM | POA: Insufficient documentation

## 2017-12-21 DIAGNOSIS — K219 Gastro-esophageal reflux disease without esophagitis: Secondary | ICD-10-CM | POA: Diagnosis not present

## 2017-12-21 DIAGNOSIS — Y9301 Activity, walking, marching and hiking: Secondary | ICD-10-CM | POA: Diagnosis not present

## 2017-12-21 DIAGNOSIS — F329 Major depressive disorder, single episode, unspecified: Secondary | ICD-10-CM | POA: Diagnosis not present

## 2017-12-21 DIAGNOSIS — I509 Heart failure, unspecified: Secondary | ICD-10-CM | POA: Insufficient documentation

## 2017-12-21 DIAGNOSIS — F32A Depression, unspecified: Secondary | ICD-10-CM

## 2017-12-21 LAB — BASIC METABOLIC PANEL
ANION GAP: 10 (ref 5–15)
BUN: 20 mg/dL (ref 6–20)
CO2: 26 mmol/L (ref 22–32)
Calcium: 9.2 mg/dL (ref 8.9–10.3)
Chloride: 106 mmol/L (ref 101–111)
Creatinine, Ser: 1.08 mg/dL (ref 0.61–1.24)
Glucose, Bld: 109 mg/dL — ABNORMAL HIGH (ref 65–99)
Potassium: 4.7 mmol/L (ref 3.5–5.1)
SODIUM: 142 mmol/L (ref 135–145)

## 2017-12-21 LAB — CBC
HCT: 50 % (ref 39.0–52.0)
HEMOGLOBIN: 16.2 g/dL (ref 13.0–17.0)
MCH: 30.8 pg (ref 26.0–34.0)
MCHC: 32.4 g/dL (ref 30.0–36.0)
MCV: 95.1 fL (ref 78.0–100.0)
PLATELETS: 175 10*3/uL (ref 150–400)
RBC: 5.26 MIL/uL (ref 4.22–5.81)
RDW: 14.3 % (ref 11.5–15.5)
WBC: 9.1 10*3/uL (ref 4.0–10.5)

## 2017-12-21 LAB — MAGNESIUM: MAGNESIUM: 2.2 mg/dL (ref 1.7–2.4)

## 2017-12-21 LAB — D-DIMER, QUANTITATIVE (NOT AT ARMC): D DIMER QUANT: 0.41 ug{FEU}/mL (ref 0.00–0.50)

## 2017-12-21 LAB — I-STAT TROPONIN, ED: TROPONIN I, POC: 0 ng/mL (ref 0.00–0.08)

## 2017-12-21 LAB — BRAIN NATRIURETIC PEPTIDE: B NATRIURETIC PEPTIDE 5: 38.7 pg/mL (ref 0.0–100.0)

## 2017-12-21 MED ORDER — DONEPEZIL HCL 10 MG PO TABS
10.0000 mg | ORAL_TABLET | Freq: Every day | ORAL | Status: DC
  Administered 2017-12-21: 10 mg via ORAL
  Filled 2017-12-21: qty 1

## 2017-12-21 MED ORDER — MONTELUKAST SODIUM 10 MG PO TABS
10.0000 mg | ORAL_TABLET | Freq: Every day | ORAL | Status: DC
  Administered 2017-12-21: 10 mg via ORAL
  Filled 2017-12-21: qty 1

## 2017-12-21 MED ORDER — POLYETHYLENE GLYCOL 3350 17 G PO PACK: 17.0000 g | PACK | ORAL | Status: DC

## 2017-12-21 MED ORDER — ADULT MULTIVITAMIN W/MINERALS CH
1.0000 | ORAL_TABLET | Freq: Every day | ORAL | Status: DC
  Administered 2017-12-22: 1 via ORAL
  Filled 2017-12-21 (×2): qty 1

## 2017-12-21 MED ORDER — IOPAMIDOL (ISOVUE-370) INJECTION 76%
100.0000 mL | Freq: Once | INTRAVENOUS | Status: AC | PRN
  Administered 2017-12-21: 66 mL via INTRAVENOUS

## 2017-12-21 MED ORDER — SACCHAROMYCES BOULARDII 250 MG PO CAPS
250.0000 mg | ORAL_CAPSULE | Freq: Two times a day (BID) | ORAL | Status: DC
  Administered 2017-12-21 – 2017-12-22 (×2): 250 mg via ORAL
  Filled 2017-12-21 (×2): qty 1

## 2017-12-21 MED ORDER — ASPIRIN EC 81 MG PO TBEC
81.0000 mg | DELAYED_RELEASE_TABLET | Freq: Every day | ORAL | Status: DC
  Administered 2017-12-22: 81 mg via ORAL
  Filled 2017-12-21: qty 1

## 2017-12-21 MED ORDER — MELATONIN 3 MG PO TABS
3.0000 mg | ORAL_TABLET | Freq: Every day | ORAL | Status: DC
  Administered 2017-12-21: 3 mg via ORAL
  Filled 2017-12-21: qty 1

## 2017-12-21 MED ORDER — TRAMADOL HCL 50 MG PO TABS
50.0000 mg | ORAL_TABLET | Freq: Three times a day (TID) | ORAL | Status: DC
  Administered 2017-12-21 – 2017-12-22 (×3): 50 mg via ORAL
  Filled 2017-12-21 (×3): qty 1

## 2017-12-21 MED ORDER — MIRTAZAPINE 15 MG PO TABS
30.0000 mg | ORAL_TABLET | Freq: Every day | ORAL | Status: DC
  Administered 2017-12-21: 30 mg via ORAL
  Filled 2017-12-21: qty 2

## 2017-12-21 MED ORDER — GABAPENTIN 300 MG PO CAPS
300.0000 mg | ORAL_CAPSULE | Freq: Three times a day (TID) | ORAL | Status: DC
  Administered 2017-12-21 – 2017-12-22 (×3): 300 mg via ORAL
  Filled 2017-12-21 (×3): qty 1

## 2017-12-21 MED ORDER — CHLORHEXIDINE GLUCONATE 0.12 % MT SOLN
15.0000 mL | Freq: Two times a day (BID) | OROMUCOSAL | Status: DC
  Administered 2017-12-21 – 2017-12-22 (×2): 15 mL via OROMUCOSAL
  Filled 2017-12-21 (×2): qty 15

## 2017-12-21 MED ORDER — LORAZEPAM 1 MG PO TABS
1.0000 mg | ORAL_TABLET | Freq: Three times a day (TID) | ORAL | Status: DC
  Administered 2017-12-21 – 2017-12-22 (×3): 1 mg via ORAL
  Filled 2017-12-21 (×3): qty 1

## 2017-12-21 MED ORDER — MIRABEGRON ER 25 MG PO TB24
25.0000 mg | ORAL_TABLET | Freq: Every day | ORAL | Status: DC
  Administered 2017-12-22: 25 mg via ORAL
  Filled 2017-12-21: qty 1

## 2017-12-21 MED ORDER — ORAL CARE MOUTH RINSE
15.0000 mL | Freq: Two times a day (BID) | OROMUCOSAL | Status: DC
  Administered 2017-12-21 – 2017-12-22 (×2): 15 mL via OROMUCOSAL

## 2017-12-21 MED ORDER — MELOXICAM 15 MG PO TABS
15.0000 mg | ORAL_TABLET | Freq: Every day | ORAL | Status: DC
  Administered 2017-12-22: 15 mg via ORAL
  Filled 2017-12-21: qty 1

## 2017-12-21 MED ORDER — IPRATROPIUM BROMIDE 0.03 % NA SOLN
2.0000 | Freq: Three times a day (TID) | NASAL | Status: DC | PRN
  Filled 2017-12-21: qty 30

## 2017-12-21 MED ORDER — FLUTICASONE PROPIONATE 50 MCG/ACT NA SUSP
2.0000 | Freq: Every day | NASAL | Status: DC
  Administered 2017-12-22: 2 via NASAL
  Filled 2017-12-21: qty 16

## 2017-12-21 MED ORDER — ACETAMINOPHEN 325 MG PO TABS: 650.0000 mg | ORAL_TABLET | Freq: Four times a day (QID) | ORAL | Status: DC | PRN

## 2017-12-21 MED ORDER — LORATADINE 10 MG PO TABS
10.0000 mg | ORAL_TABLET | Freq: Every day | ORAL | Status: DC
  Administered 2017-12-22: 10 mg via ORAL
  Filled 2017-12-21: qty 1

## 2017-12-21 MED ORDER — AZELASTINE HCL 0.1 % NA SOLN
2.0000 | Freq: Two times a day (BID) | NASAL | Status: DC
  Administered 2017-12-21 – 2017-12-22 (×2): 2 via NASAL
  Filled 2017-12-21: qty 30

## 2017-12-21 MED ORDER — PRAVASTATIN SODIUM 20 MG PO TABS
20.0000 mg | ORAL_TABLET | Freq: Every day | ORAL | Status: DC
  Administered 2017-12-22: 20 mg via ORAL
  Filled 2017-12-21 (×2): qty 1

## 2017-12-21 MED ORDER — FAMOTIDINE 20 MG PO TABS
20.0000 mg | ORAL_TABLET | Freq: Every day | ORAL | Status: DC
  Administered 2017-12-21: 20 mg via ORAL
  Filled 2017-12-21: qty 1

## 2017-12-21 MED ORDER — OXYCODONE HCL 5 MG PO TABS
5.0000 mg | ORAL_TABLET | ORAL | Status: DC | PRN
  Administered 2017-12-21: 5 mg via ORAL
  Filled 2017-12-21: qty 1

## 2017-12-21 MED ORDER — QUETIAPINE FUMARATE 25 MG PO TABS
25.0000 mg | ORAL_TABLET | Freq: Every day | ORAL | Status: DC
  Administered 2017-12-21: 25 mg via ORAL
  Filled 2017-12-21: qty 1

## 2017-12-21 MED ORDER — PAROXETINE HCL 20 MG PO TABS
20.0000 mg | ORAL_TABLET | Freq: Every day | ORAL | Status: DC
  Administered 2017-12-22: 20 mg via ORAL
  Filled 2017-12-21: qty 1

## 2017-12-21 MED ORDER — IOPAMIDOL (ISOVUE-370) INJECTION 76%
INTRAVENOUS | Status: AC
  Filled 2017-12-21: qty 100

## 2017-12-21 MED ORDER — ALBUTEROL SULFATE (2.5 MG/3ML) 0.083% IN NEBU: 2.5000 mg | INHALATION_SOLUTION | RESPIRATORY_TRACT | Status: DC | PRN

## 2017-12-21 NOTE — ED Notes (Signed)
CHAPLAIN MADE AWARE OF PT'S CURRENT STATUS AND WILL SEE PT ON 5 EAST

## 2017-12-21 NOTE — ED Notes (Signed)
ED Provider at bedside. 

## 2017-12-21 NOTE — H&P (Addendum)
History and Physical    Christopher Jeancharles Sr. ZOX:[id number] DOB: [birth year] DOA: 12/21/2017  PCP: Christopher Moron, MD  Patient coming from: home  I have personally briefly reviewed patient's old medical records in [facility]  Chief Complaint: fall  HPI: Christopher Crays Sr. is a 84 y.o. male with medical history significant of AV block status post pacemaker, GERD, depression and anxiety, heart failure, hypertension presenting after a fall with new hypoxia.   [name] notes that he got up this morning and went to the bathroom.  He does sit linoleum linoleum floor was moist and he slipped on the floor landing on his butt and hitting his back.  He denies head trauma or loss of consciousness.  He got up and went to the med tech who dispenses the a.m. meds.  They called the ambulance.  He was found to be hypoxic on evaluation by EMS and by the ED.  He denies any chest pain, fevers, chills, shortness of breath, abdominal pain, lower extremity edema, lightheadedness, dizziness, numbness, tingling, weakness.  He notes that he quit smoking 25-30 years ago and smoked about a pack a week at that time.  He denies any alcohol use.  He notes that he is "rotten mood ".  He feels like his life is been taken away from him.  He states that he wants to die, but that he would not hurt himself.   ED Course: Labs, CXR, EKG.  Hospitalist admit for new hypoxia.  Review of Systems: As per HPI otherwise 10 point review of systems negative.   Past Medical History:  Diagnosis Date  . Acid reflux   . Anxiety   . AV block   . BPH (benign prostatic hyperplasia)   . CHF (congestive heart failure) (HCC)   . Depression   . High cholesterol   . Hypertension   . Hypoxia   . Pacemaker     Past Surgical History:  Procedure Laterality Date  . CHOLECYSTECTOMY       reports that he has quit smoking. He has never used smokeless tobacco. He reports that he does not drink alcohol or use drugs.  Allergies  Allergen  Reactions  . Amoxicillin Hives  . Doxycycline Rash    History reviewed. No pertinent family history.  Prior to Admission medications   Medication Sig Start Date End Date Taking? Authorizing Provider  aspirin EC 81 MG tablet Take 81 mg by mouth daily.   Yes [provider]  azelastine (ASTELIN) 0.1 % nasal spray Place 2 sprays into both nostrils 2 (two) times daily. Use in each nostril as directed 11/12/17  Yes Ambs, Norvel Richards, FNP  cetirizine (ZYRTEC) 10 MG tablet Take 1 tablet (10 mg total) by mouth daily. Patient taking differently: Take 10 mg by mouth daily as needed for allergies.  09/10/17  Yes Alfonse Spruce, MD  donepezil (ARICEPT) 10 MG tablet Take 10 mg by mouth at bedtime.   Yes [provider]  FLORASTOR 250 MG capsule Take 250 mg by mouth 2 (two) times daily. For 10 days. 10/02/17  Yes [provider]  fluticasone (FLONASE) 50 MCG/ACT nasal spray Place 2 sprays into both nostrils daily. 09/10/17  Yes Alfonse Spruce, MD  gabapentin (NEURONTIN) 300 MG capsule Take 300 mg by mouth 3 (three) times daily.   Yes [provider]  HYDROcodone-acetaminophen (NORCO/VICODIN) 5-325 MG tablet Take 1 tablet by mouth every 6 (six) hours as needed for moderate pain.   Yes [provider]  ipratropium (ATROVENT) 0.03 % nasal spray Place 2 sprays into both nostrils every 6 (six) hours as needed for rhinitis. 09/10/17  Yes Alfonse Spruce, MD  LORazepam (ATIVAN) 1 MG tablet Take 1 mg by mouth 3 (three) times daily.    Yes [provider]  Melatonin 3 MG TABS Take 3 mg by mouth at bedtime.    Yes [provider]  meloxicam (MOBIC) 15 MG tablet Take 15 mg by mouth daily.   Yes [provider]  mirtazapine (REMERON) 15 MG tablet Take 30 mg by mouth at bedtime.    Yes [provider]  montelukast (SINGULAIR) 10 MG tablet Take 1 tablet (10 mg total) by mouth at bedtime. 09/10/17  Yes Alfonse Spruce, MD  Multiple  Vitamins-Minerals (MULTIVITAMIN WITH MINERALS) tablet Take 1 tablet by mouth daily.   Yes [provider]  MYRBETRIQ 25 MG TB24 tablet Take 25 mg by mouth daily. 12/18/17  Yes [provider]  PARoxetine (PAXIL) 10 MG tablet Take 10 mg by mouth daily.   Yes [provider]  polyethylene glycol (MIRALAX / GLYCOLAX) packet Take 17 g by mouth every other day.    Yes [provider]  pravastatin (PRAVACHOL) 20 MG tablet Take 20 mg by mouth daily.   Yes [provider]  QUEtiapine (SEROQUEL) 25 MG tablet Take 25 mg by mouth at bedtime.   Yes [provider]  ranitidine (ZANTAC) 150 MG tablet Take 150 mg by mouth at bedtime.    Yes [provider]  Skin Protectants, Misc. (EUCERIN) cream Apply 1 application topically as needed for dry skin.    Yes [provider]  traMADol (ULTRAM) 50 MG tablet Take 50 mg by mouth 3 (three) times daily.    Yes [provider]  acetaminophen (TYLENOL) 325 MG tablet Take 2 tablets (650 mg total) by mouth every 6 (six) hours as needed. Patient taking differently: Take 650 mg by mouth every 6 (six) hours as needed for mild pain.  03/01/17   Barrett Henle, PA-C  oxybutynin (DITROPAN-XL) 10 MG 24 hr tablet Take 10 mg by mouth daily.    [provider]    Physical Exam: Vitals:   12/21/17 1130 12/21/17 1300 12/21/17 1400 12/21/17 1454  BP: (!) 150/82 (!) 152/86 (!) 153/87 (!) 142/88  Pulse: 72 71 88 84  Resp: 20   19  Temp:    98.8 F (37.1 C)  TempSrc:    Oral  SpO2: 94% 93% 92% 94%    Constitutional: NAD, calm, comfortable Vitals:   12/21/17 1130 12/21/17 1300 12/21/17 1400 12/21/17 1454  BP: (!) 150/82 (!) 152/86 (!) 153/87 (!) 142/88  Pulse: 72 71 88 84  Resp: 20   19  Temp:    98.8 F (37.1 C)  TempSrc:    Oral  SpO2: 94% 93% 92% 94%   Eyes: PERRL, lids and conjunctivae normal ENMT: Mucous membranes are moist. Posterior pharynx clear of any exudate or  lesions.Normal dentition.  NCAT. Neck: normal, supple, no masses, no thyromegaly Respiratory: clear to auscultation bilaterally, no wheezing, no crackles. Normal respiratory effort. No accessory muscle use.  Cardiovascular: Regular rate and rhythm, no murmurs / rubs / gallops. No extremity edema. 2+ pedal pulses. No carotid bruits.  Abdomen: no tenderness, no masses palpated. No hepatosplenomegaly. Bowel sounds positive.  Musculoskeletal: no clubbing / cyanosis. No joint deformity upper and lower extremities. Good ROM, no contractures. Normal muscle tone.  No midline  TTP of back.  No TTP of extremities.   Skin: no rashes, lesions, ulcers. No induration Neurologic: CN 2-12 intact. Sensation intact. Strength 5/5 in all 4.  Psychiatric: Normal judgment and insight. Alert and oriented x 2. Normal mood.   Labs on Admission: I have personally reviewed following labs and imaging studies  CBC: Recent Labs  Lab 12/21/17 1033  WBC 9.1  HGB 16.2  HCT 50.0  MCV 95.1  PLT 175   Basic Metabolic Panel: Recent Labs  Lab 12/21/17 1033  NA 142  K 4.7  CL 106  CO2 26  GLUCOSE 109*  BUN 20  CREATININE 1.08  CALCIUM 9.2   GFR: CrCl cannot be calculated (Unknown ideal weight.). Liver Function Tests: No results for input(s): AST, ALT, ALKPHOS, BILITOT, PROT, ALBUMIN in the last 168 hours. No results for input(s): LIPASE, AMYLASE in the last 168 hours. No results for input(s): AMMONIA in the last 168 hours. Coagulation Profile: No results for input(s): INR, PROTIME in the last 168 hours. Cardiac Enzymes: No results for input(s): CKTOTAL, CKMB, CKMBINDEX, TROPONINI in the last 168 hours. BNP (last 3 results) No results for input(s): PROBNP in the last 8760 hours. HbA1C: No results for input(s): HGBA1C in the last 72 hours. CBG: No results for input(s): GLUCAP in the last 168 hours. Lipid Profile: No results for input(s): CHOL, HDL, LDLCALC, TRIG, CHOLHDL, LDLDIRECT in the last 72  hours. Thyroid Function Tests: No results for input(s): TSH, T4TOTAL, FREET4, T3FREE, THYROIDAB in the last 72 hours. Anemia Panel: No results for input(s): VITAMINB12, FOLATE, FERRITIN, TIBC, IRON, RETICCTPCT in the last 72 hours. Urine analysis:    Component Value Date/Time   COLORURINE YELLOW 10/07/2017 1347   APPEARANCEUR CLEAR 10/07/2017 1347   LABSPEC 1.023 10/07/2017 1347   PHURINE 6.0 10/07/2017 1347   GLUCOSEU NEGATIVE 10/07/2017 1347   HGBUR NEGATIVE 10/07/2017 1347   BILIRUBINUR NEGATIVE 10/07/2017 1347   KETONESUR 5 (A) 10/07/2017 1347   PROTEINUR NEGATIVE 10/07/2017 1347   NITRITE NEGATIVE 10/07/2017 1347   LEUKOCYTESUR NEGATIVE 10/07/2017 1347    Radiological Exams on Admission: Dg Chest 2 View  Result Date: 12/21/2017 CLINICAL DATA:  Shortness of breath EXAM: CHEST - 2 VIEW COMPARISON:  11/27/2016 FINDINGS: Cardiac shadow is stable. Pacing device is again seen. Stable coarsened markings are noted within both lungs but particularly in the left lung base. Some new right basilar atelectasis is seen. No bony abnormality is noted. No effusion is seen. IMPRESSION: New right basilar atelectasis. Stable appearance in the left base is seen. Electronically Signed   By: Alcide CleverMark  Lukens M.D.   On: 12/21/2017 10:28   Ct Angio Chest Pe W And/or Wo Contrast  Result Date: 12/21/2017 CLINICAL DATA:  Hypoxia following a fall today.  Chronic COPD. EXAM: CT ANGIOGRAPHY CHEST WITH CONTRAST TECHNIQUE: Multidetector CT imaging of the chest was performed using the standard protocol during bolus administration of intravenous contrast. Multiplanar CT image reconstructions and MIPs were obtained to evaluate the vascular anatomy. CONTRAST:  66mL ISOVUE-370 IOPAMIDOL (ISOVUE-370) INJECTION 76% COMPARISON:  Chest radiographs obtained earlier today and chest CTA dated 04/29/2016. FINDINGS: Cardiovascular: Atheromatous aortic calcifications. Normally opacified pulmonary arteries no pulmonary arterial filling  defects seen. Mediastinum/Nodes: No enlarged mediastinal, hilar, or axillary lymph nodes. Thyroid gland, trachea, and esophagus demonstrate no significant findings. Lungs/Pleura: Diffusely prominent interstitial markings with bilateral bullous changes, most pronounced in the periphery of the lungs. Small right upper lobe and left lower lobe calcified granulomata. No pleural fluid. Upper Abdomen: Cholecystectomy  clips. Musculoskeletal: Thoracic spine degenerative changes. Review of the MIP images confirms the above findings. IMPRESSION: 1. No pulmonary emboli or acute abnormality. 2. Changes of COPD with interstitial fibrosis without significant change. 3. Calcified aortic atherosclerosis. Aortic Atherosclerosis (ICD10-I70.0) and Emphysema (ICD10-J43.9). Electronically Signed   By: Beckie Salts M.D.   On: 12/21/2017 14:08    EKG: Independently reviewed. Atrial paced.  Appears similar to prior EKG.  Assessment/Plan Active Problems:   Hypoxia   Acute Hypoxic Respiratory Failure:  Unclear how new this is, but no history that I was able to find.  He does not have a known history of COPD, but is chest CT showed changes consistent with COPD and he has a history of smoking as well.  Suspect this is most likely etiology.   Initially ordered echo, but will d/c with CT findings and no evidence of HF on exam Will need O2 on discharge (discussed with nursing to walk him and document needs) Prn nebs  Mechanical Fall: with some residual back pain, but exam reassuring.  Follow.  Prn pain meds.  Depression  Anxiety  Passive SI:  Significant psych hx.  Notes he wants to die, but denies active plan or that he would go through with something like that.  A lot seems to surround dissatisfaction with being at ALF and feeling like he doesn't have anyone to talk to there. Will c/s psych Continue remeron, ativan, seroquel, paxil  Hx CHF: chart hx, euvolemic.  Not on any diuretic.  Follow  Dementia:  A&O x 2.  Delirium  precautions.  Continue aricept.  Hx AV block  Pacemaker: stable  Prolonged QTc: stable, caution with qt prolonging meds (he's on several).  Follow mag, repeat in AM.  HLD: statin GERD: H2 blocker Allergies: home meds Overactive bladder: oxybutrinin, myrbetriq  DVT prophylaxis: scd  Code Status: dnr  Family Communication: attempted to call daughter, left message  Disposition Plan: likely d/c tomorrow to [place] with home O2 Consults called: psych  Admission status: obs    Lacretia Nicks MD [facility] Pager [phone]  If 7PM-7AM, please contact night-coverage [email or web address] Password [facility]  12/21/2017, 3:36 PM

## 2017-12-21 NOTE — ED Notes (Signed)
PT REQUESTING CHAPLAIN. CHAPLAIN PAGED.

## 2017-12-21 NOTE — ED Notes (Signed)
ED Provider at bedside. EDP JON KNAPP PRESENT

## 2017-12-21 NOTE — ED Notes (Signed)
ED TO INPATIENT HANDOFF REPORT  Name/Age/Gender Christopher Borer Sr. 84 y.o. male  Code Status Advance Directive Documentation     Most Recent Value  Type of Advance Directive  Living will  Pre-existing out of facility DNR order (yellow form or pink MOST form)  -  "MOST" Form in Place?  -      Home/SNF/Other Nursing Home  Chief Complaint Fall  Level of Care/Admitting Diagnosis ED Disposition    ED Disposition Condition [place]: [place] [100102]  Level of Care: Telemetry [5]  Admit to tele based on following criteria: Other see comments  Comments: fall, hypoxia  Diagnosis: Hypoxia [300808]  Admitting Physician: Elodia Florence [phone]  Attending Physician: Cephus Slater, A CALDWELL [phone]  PT Class (Do Not Modify): Observation [104]  PT Acc Code (Do Not Modify): Observation [10022]       Medical History Past Medical History:  Diagnosis Date  . Acid reflux   . Anxiety   . AV block   . BPH (benign prostatic hyperplasia)   . CHF (congestive heart failure) ([facility])   . Depression   . High cholesterol   . Hypertension   . Hypoxia   . Pacemaker     Allergies Allergies  Allergen Reactions  . Amoxicillin Hives  . Doxycycline Rash    IV Location/Drains/Wounds Patient Lines/Drains/Airways Status   Active Line/Drains/Airways    Name:   Placement date:   Placement time:   Site:   Days:   Peripheral IV 12/21/17 Left Antecubital   12/21/17    1031    Antecubital   less than 1   Peripheral IV 12/21/17 Right Antecubital   12/21/17    1349    Antecubital   less than 1          Labs/Imaging Results for orders placed or performed during the hospital encounter of 12/21/17 (from the past 48 hour(s))  Basic metabolic panel     Status: Abnormal   Collection Time: 12/21/17 10:33 AM  Result Value Ref Range   Sodium 142 135 - 145 mmol/L   Potassium 4.7 3.5 - 5.1 mmol/L   Chloride 106 101 - 111 mmol/L   CO2 26 22 - 32  mmol/L   Glucose, Bld 109 (H) 65 - 99 mg/dL   BUN 20 6 - 20 mg/dL   Creatinine, Ser 1.08 0.61 - 1.24 mg/dL   Calcium 9.2 8.9 - 10.3 mg/dL   GFR calc non Af Amer >60 >60 mL/min   GFR calc Af Amer >60 >60 mL/min    Comment: (NOTE) The eGFR has been calculated using the CKD EPI equation. This calculation has not been validated in all clinical situations. eGFR's persistently <60 mL/min signify possible Chronic Kidney Disease.    Anion gap 10 5 - 15    Comment: Performed at [facility], [place] [address] [place], [place] [address]  CBC     Status: None   Collection Time: 12/21/17 10:33 AM  Result Value Ref Range   WBC 9.1 4.0 - 10.5 K/uL   RBC 5.26 4.22 - 5.81 MIL/uL   Hemoglobin 16.2 13.0 - 17.0 g/dL   HCT 50.0 39.0 - 52.0 %   MCV 95.1 78.0 - 100.0 fL   MCH 30.8 26.0 - 34.0 pg   MCHC 32.4 30.0 - 36.0 g/dL   RDW 14.3 11.5 - 15.5 %   Platelets 175 150 - 400 K/uL    Comment: Performed  at [facility], [place] [address], [place], [place] [address]  Brain natriuretic peptide     Status: None   Collection Time: 12/21/17 10:33 AM  Result Value Ref Range   B Natriuretic Peptide 38.7 0.0 - 100.0 pg/mL    Comment: Performed at [facility], [place] [address] [place], [place] [address]  I-stat troponin, ED  (not at [facility], [facility])     Status: None   Collection Time: 12/21/17 10:41 AM  Result Value Ref Range   Troponin i, poc 0.00 0.00 - 0.08 ng/mL   Comment 3            Comment: Due to the release kinetics of cTnI, a negative result within the first hours of the onset of symptoms does not rule out myocardial infarction with certainty. If myocardial infarction is still suspected, repeat the test at appropriate intervals.   D-dimer, quantitative (not at [facility])     Status: None   Collection Time: 12/21/17 10:45 AM  Result Value Ref Range   D-Dimer, Quant 0.41 0.00 - 0.50 ug/mL-FEU    Comment: (NOTE) At the manufacturer cut-off of 0.50 ug/mL  FEU, this assay has been documented to exclude PE with a sensitivity and negative predictive value of 97 to 99%.  At this time, this assay has not been approved by the FDA to exclude DVT/VTE. Results should be correlated with clinical presentation. Performed at [facility], [place] [address], [place], [place] [address]    Dg Chest 2 View  Result Date: 12/21/2017 CLINICAL DATA:  Shortness of breath EXAM: CHEST - 2 VIEW COMPARISON:  11/27/2016 FINDINGS: Cardiac shadow is stable. Pacing device is again seen. Stable coarsened markings are noted within both lungs but particularly in the left lung base. Some new right basilar atelectasis is seen. No bony abnormality is noted. No effusion is seen. IMPRESSION: New right basilar atelectasis. Stable appearance in the left base is seen. Electronically Signed   By: Inez Catalina M.D.   On: 12/21/2017 10:28   Ct Angio Chest Pe W And/or Wo Contrast  Result Date: 12/21/2017 CLINICAL DATA:  Hypoxia following a fall today.  Chronic COPD. EXAM: CT ANGIOGRAPHY CHEST WITH CONTRAST TECHNIQUE: Multidetector CT imaging of the chest was performed using the standard protocol during bolus administration of intravenous contrast. Multiplanar CT image reconstructions and MIPs were obtained to evaluate the vascular anatomy. CONTRAST:  50m ISOVUE-370 IOPAMIDOL (ISOVUE-370) INJECTION 76% COMPARISON:  Chest radiographs obtained earlier today and chest CTA dated 04/29/2016. FINDINGS: Cardiovascular: Atheromatous aortic calcifications. Normally opacified pulmonary arteries no pulmonary arterial filling defects seen. Mediastinum/Nodes: No enlarged mediastinal, hilar, or axillary lymph nodes. Thyroid gland, trachea, and esophagus demonstrate no significant findings. Lungs/Pleura: Diffusely prominent interstitial markings with bilateral bullous changes, most pronounced in the periphery of the lungs. Small right upper lobe and left lower lobe calcified granulomata. No  pleural fluid. Upper Abdomen: Cholecystectomy clips. Musculoskeletal: Thoracic spine degenerative changes. Review of the MIP images confirms the above findings. IMPRESSION: 1. No pulmonary emboli or acute abnormality. 2. Changes of COPD with interstitial fibrosis without significant change. 3. Calcified aortic atherosclerosis. Aortic Atherosclerosis (ICD10-I70.0) and Emphysema (ICD10-J43.9). Electronically Signed   By: SClaudie ReveringM.D.   On: 12/21/2017 14:08    Pending Labs [employer](From admission, onward)   Start     Ordered   Signed and Held  Comprehensive metabolic panel  Tomorrow morning,   R     Signed and Held   Signed and  Held  CBC  Tomorrow morning,   R     Signed and Held      Vitals/Pain Today's Vitals   12/21/17 1030 12/21/17 1130 12/21/17 1300 12/21/17 1400  BP: (!) 149/75 (!) 150/82 (!) 152/86 (!) 153/87  Pulse: 69 72 71 88  Resp: (!) 23 20    Temp:      TempSrc:      SpO2: 96% 94% 93% 92%  PainSc:        Isolation Precautions No active isolations  Medications Medications  oxyCODONE (Oxy IR/ROXICODONE) immediate release tablet 5 mg (has no administration in time range)  acetaminophen (TYLENOL) tablet 650 mg (has no administration in time range)  iopamidol (ISOVUE-370) 76 % injection (has no administration in time range)  iopamidol (ISOVUE-370) 76 % injection 100 mL (66 mLs Intravenous Contrast Given 12/21/17 1343)    Mobility walks

## 2017-12-21 NOTE — ED Notes (Signed)
Bed: WA19 Expected date:  Expected time:  Means of arrival:  Comments: EMS-fall 

## 2017-12-21 NOTE — ED Notes (Signed)
EDP JON KNAPP MADE AWARE OF PT'S REQUEST FOR DNR. ALSO MADE AWARE OF PT'S CONTINUED STATEMENTS OF DEATH AND WANTING TO DIE. EDP MADE AWARE OF EARLIER CONVERSATION. ENCOURAGED TO SEE PT AND EVALUATE STATUS. PT HAS HX OF DEPRESSION.

## 2017-12-21 NOTE — ED Notes (Addendum)
Patient asks, "My body is trying to die on me, isn't it?" I respond "I'm not so sure about that." patient responds, "I'd be better off if I did die." Morrie SheldonAshley, RN notified, RN immediately notified Lynelle DoctorKnapp, MD.

## 2017-12-21 NOTE — ED Triage Notes (Signed)
Patient brought in by EMS for a fall. Patient slipped and fell in the bathroom. Patient was laying down in the bed when EMS arrived. Denies LOC. Denies pain just wanted to get checked out due to his age. [facility] from [facility] [place] [address]

## 2017-12-21 NOTE — ED Notes (Signed)
PT STATES HE WANTS TO DIE. PT STATES IF HIS HEART STOPS DO NOT RESUSCITATE HIM. I ASKED IF HE HAD A 'GOLDEN TICKET OR DNR? PT'S RESPONDED NO. I TOLD HIM WE WOULD HONOR HIS REQUEST IF HE SHOULD APPROACH THIS IN HIS CARE HERE. PT CRYING. AS I DISCUSSED REASON FOR HIS FEELING SO SAD, PT RESPONDED HE HAD NO CONTACT WITH HIS FAMILY DAUGHTERS, SON AND MULTIPLE GRANDCHILDREN.  HE ALSO STATED THEY WANTED NOTHING TO DO WITH HIM.

## 2017-12-21 NOTE — ED Provider Notes (Addendum)
[facility]-EMERGENCY DEPT Provider Note   CSN: [id number] Arrival date & time: 12/21/17  [id number]     History   Chief Complaint No chief complaint on file.   HPI Christopher FreestoneJoseph Rahal Sr. is a 84 y.o. male.  HPI Pt was walking this morning and slipped on the wet linoleum floor.  His feet went from under him and he landed on his bottom.  EMS was called and they helped him up.  He was able to get up and walk.  Pt denies any pain.  He thought it would be a good idea to get checked out after he fell.  EMS indicated oxygen sat was 85 when they arrived.  Pt is not normally on home oxygen.  Pt denies chest pain or shortness of breath.  No fevers or chills.  No cough.  No vomiting or diarrhea. Past Medical History:  Diagnosis Date  . Acid reflux   . Anxiety   . AV block   . BPH (benign prostatic hyperplasia)   . CHF (congestive heart failure) (HCC)   . Depression   . High cholesterol   . Hypertension   . Hypoxia   . Pacemaker     Patient Active Problem List   Diagnosis Date Noted  . Chronic rhinitis 09/10/2017    Past Surgical History:  Procedure Laterality Date  . CHOLECYSTECTOMY          Home Medications    Prior to Admission medications   Medication Sig Start Date End Date Taking? Authorizing Provider  acetaminophen (TYLENOL) 325 MG tablet Take 2 tablets (650 mg total) by mouth every 6 (six) hours as needed. Patient taking differently: Take 650 mg by mouth every 6 (six) hours as needed for mild pain.  03/01/17   Barrett HenleNadeau, Nicole Elizabeth, PA-C  aspirin EC 81 MG tablet Take 81 mg by mouth daily.    [provider]  azelastine (ASTELIN) 0.1 % nasal spray Place 2 sprays into both nostrils 2 (two) times daily. Use in each nostril as directed 11/12/17   Ambs, Norvel RichardsAnne M, FNP  cetirizine (ZYRTEC) 10 MG tablet Take 1 tablet (10 mg total) by mouth daily. Patient taking differently: Take 10 mg by mouth daily as needed for allergies.  09/10/17   Alfonse SpruceGallagher, Joel Louis,  MD  donepezil (ARICEPT) 10 MG tablet Take 10 mg by mouth at bedtime.    [provider]  FLORASTOR 250 MG capsule Take 250 mg by mouth 2 (two) times daily. For 10 days. 10/02/17   [provider]  fluticasone (FLONASE) 50 MCG/ACT nasal spray Place 2 sprays into both nostrils daily. Patient not taking: Reported on 11/12/2017 09/10/17   Alfonse SpruceGallagher, Joel Louis, MD  gabapentin (NEURONTIN) 300 MG capsule Take 300 mg by mouth 3 (three) times daily.    [provider]  HYDROcodone-acetaminophen (NORCO/VICODIN) 5-325 MG tablet Take 1 tablet by mouth every 6 (six) hours as needed for moderate pain.    [provider]  ipratropium (ATROVENT) 0.03 % nasal spray Place 2 sprays into both nostrils every 6 (six) hours as needed for rhinitis. Patient not taking: Reported on 11/12/2017 09/10/17   Alfonse SpruceGallagher, Joel Louis, MD  LORazepam (ATIVAN) 1 MG tablet Take 1 mg by mouth 3 (three) times daily.     [provider]  Melatonin 3 MG TABS Take 3 mg by mouth at bedtime.     [provider]  meloxicam (MOBIC) 15 MG tablet Take 15 mg by mouth daily.  [provider]  mirtazapine (REMERON) 15 MG tablet Take 30 mg by mouth at bedtime.     [provider]  montelukast (SINGULAIR) 10 MG tablet Take 1 tablet (10 mg total) by mouth at bedtime. 09/10/17   Alfonse Spruce, MD  Multiple Vitamins-Minerals (MULTIVITAMIN WITH MINERALS) tablet Take 1 tablet by mouth daily.    [provider]  oxybutynin (DITROPAN-XL) 10 MG 24 hr tablet Take 10 mg by mouth daily.    [provider]  PARoxetine (PAXIL) 10 MG tablet Take 10 mg by mouth daily.    [provider]  polyethylene glycol (MIRALAX / GLYCOLAX) packet Take 17 g by mouth every other day.     [provider]  pravastatin (PRAVACHOL) 20 MG tablet Take 20 mg by mouth daily.    [provider]  QUEtiapine (SEROQUEL) 25 MG tablet Take 25 mg by mouth at bedtime.    [provider]  ranitidine (ZANTAC) 150 MG tablet Take 150 mg by mouth at bedtime.     [provider]  Skin Protectants, Misc. (EUCERIN) cream Apply 1 application topically as needed for dry skin.     [provider]  traMADol (ULTRAM) 50 MG tablet Take 50 mg by mouth 3 (three) times daily.     [provider]    Family History History reviewed. No pertinent family history.  Social History Social History   Tobacco Use  . Smoking status: Former Games developer  . Smokeless tobacco: Never Used  Substance Use Topics  . Alcohol use: No  . Drug use: No     Allergies   Amoxicillin and Doxycycline   Review of Systems Review of Systems  All other systems reviewed and are negative.    Physical Exam Updated Vital Signs BP (!) 150/82   Pulse 72   Temp 98.7 F (37.1 C) (Oral)   Resp 20   SpO2 94%   Physical Exam  Constitutional: He appears well-developed and well-nourished. No distress.  HENT:  Head: Normocephalic and atraumatic. Head is without raccoon's eyes and without Battle's sign.  Right Ear: External ear normal.  Left Ear: External ear normal.  Eyes: Conjunctivae and lids are normal. Right eye exhibits no discharge. Left eye exhibits no discharge. Right conjunctiva has no hemorrhage. Left conjunctiva has no hemorrhage. No scleral icterus.  Neck: Neck supple. No spinous process tenderness present. No tracheal deviation and no edema present.  Cardiovascular: Normal rate, regular rhythm, normal heart sounds and intact distal pulses.  Pulmonary/Chest: Effort normal and breath sounds normal. No stridor. No respiratory distress. He has no wheezes. He has no rales. He exhibits no tenderness, no crepitus and no deformity.  Abdominal: Soft. Normal appearance and bowel sounds are normal. He exhibits no distension and no mass. There is no tenderness. There is no rebound and no guarding.  Negative for seat belt sign  Musculoskeletal: He exhibits no edema or  tenderness.       Cervical back: He exhibits no tenderness, no swelling and no deformity.       Thoracic back: He exhibits no tenderness, no swelling and no deformity.       Lumbar back: He exhibits no tenderness and no swelling.  Pelvis stable, no ttp  Neurological: He is alert. He has normal strength. No cranial nerve deficit (no facial droop, extraocular movements intact, no slurred speech) or sensory deficit. He exhibits normal muscle tone. He displays no seizure activity. Coordination normal. GCS eye subscore is 4. GCS  verbal subscore is 5. GCS motor subscore is 6.  Able to move all extremities, sensation intact throughout  Skin: Skin is warm and dry. No rash noted. He is not diaphoretic.  Psychiatric: He has a normal mood and affect. His speech is normal and behavior is normal.  Nursing note and vitals reviewed.    ED Treatments / Results  Labs (all labs ordered are listed, but only abnormal results are displayed) Labs Reviewed  BASIC METABOLIC PANEL - Abnormal; Notable for the following components:      Result Value   Glucose, Bld 109 (*)    All other components within normal limits  CBC  BRAIN NATRIURETIC PEPTIDE  D-DIMER, QUANTITATIVE (NOT AT [facility])  I-STAT TROPONIN, ED    EKG EKG Interpretation  Date/Time:  Monday December 21 2017 10:01:12 EDT Ventricular Rate:  70 PR Interval:    QRS Duration: 133 QT Interval:  454 QTC Calculation: 490 R Axis:   -91 Text Interpretation:  Atrial-paced rhythm Nonspecific IVCD with LAD Anterolateral infarct, age indeterminate No significant change since last tracing Confirmed by Linwood Dibbles [phone]) on 12/21/2017 10:05:08 AM   Radiology Dg Chest 2 View  Result Date: 12/21/2017 CLINICAL DATA:  Shortness of breath EXAM: CHEST - 2 VIEW COMPARISON:  11/27/2016 FINDINGS: Cardiac shadow is stable. Pacing device is again seen. Stable coarsened markings are noted within both lungs but particularly in the left lung base. Some new right basilar  atelectasis is seen. No bony abnormality is noted. No effusion is seen. IMPRESSION: New right basilar atelectasis. Stable appearance in the left base is seen. Electronically Signed   By: Alcide Clever M.D.   On: 12/21/2017 10:28    Procedures Procedures (including critical care time)  Medications Ordered in ED Medications - No data to display   Initial Impression / Assessment and Plan / ED Course  I have reviewed the triage vital signs and the nursing notes.  Pertinent labs & imaging results that were available during my care of the patient were reviewed by me and considered in my medical decision making (see chart for details).  Clinical Course as of Dec 21 1224  Mon Dec 21, 2017  1914 Oxygen saturation drops to mid 80s when taken off oxygen.  [place] oxygen resumed   [JK]  1141 Labs and xrays reassuring.  New o2 requirement however.   Will add on a d dimer   [JK]  1224 I discussed the findings with the patient.  Atelectasis on x-ray but no definite pneumonia.  D-dimer is negative.  Doubt pulmonary embolism.  Patient does have a new oxygen requirement.   [JK]  1224 Patient also confirms that he is depressed.  No clear suicidal or homicidal ideation.  Patient has not seen anyone for his depression recently   [JK]    Clinical Course User Index [JK] Linwood Dibbles, MD    Patient presented to the emergency room for evaluation after a fall today.  Patient states it was a mechanical fall he just slipped.  However, he does have decreased oxygen saturation in the ED.  Patient denies using home oxygen.  He does have inhalers a suspect his hypoxia is related to chronic COPD.  D-dimer is negative I doubt pulmonary embolism.  No signs of congestive heart failure.  X-ray does not suggest an acute pneumonia.  I will consult the medical service for possible further evaluation and we will eventually need to arrange for the patient have home oxygen.  Patient is depressed and will  also benefit from psychiatric  evaluation.  Final Clinical Impressions(s) / ED Diagnoses   Final diagnoses:  Hypoxia  Atelectasis  Depression, unspecified depression type  Fall, initial encounter      Linwood Dibbles, MD 12/21/17 1226  D/w Dr Lowell Guitar.  Will add on CTA   Linwood Dibbles, MD 12/21/17 1233

## 2017-12-21 NOTE — ED Notes (Signed)
Patient transported to X-ray 

## 2017-12-22 ENCOUNTER — Other Ambulatory Visit (HOSPITAL_COMMUNITY): Payer: Medicare Other

## 2017-12-22 DIAGNOSIS — J9601 Acute respiratory failure with hypoxia: Secondary | ICD-10-CM | POA: Diagnosis not present

## 2017-12-22 DIAGNOSIS — R0902 Hypoxemia: Secondary | ICD-10-CM | POA: Diagnosis not present

## 2017-12-22 LAB — CBC
HEMATOCRIT: 48.4 % (ref 39.0–52.0)
HEMOGLOBIN: 15.8 g/dL (ref 13.0–17.0)
MCH: 30.7 pg (ref 26.0–34.0)
MCHC: 32.6 g/dL (ref 30.0–36.0)
MCV: 94.2 fL (ref 78.0–100.0)
Platelets: 165 10*3/uL (ref 150–400)
RBC: 5.14 MIL/uL (ref 4.22–5.81)
RDW: 14.3 % (ref 11.5–15.5)
WBC: 9.4 10*3/uL (ref 4.0–10.5)

## 2017-12-22 LAB — COMPREHENSIVE METABOLIC PANEL
ALK PHOS: 32 U/L — AB (ref 38–126)
ALT: 16 U/L — ABNORMAL LOW (ref 17–63)
ANION GAP: 10 (ref 5–15)
AST: 21 U/L (ref 15–41)
Albumin: 3.6 g/dL (ref 3.5–5.0)
BILIRUBIN TOTAL: 0.8 mg/dL (ref 0.3–1.2)
BUN: 21 mg/dL — ABNORMAL HIGH (ref 6–20)
CALCIUM: 9.1 mg/dL (ref 8.9–10.3)
CO2: 23 mmol/L (ref 22–32)
CREATININE: 1.01 mg/dL (ref 0.61–1.24)
Chloride: 107 mmol/L (ref 101–111)
GFR calc non Af Amer: >60 mL/min (ref >60)
Glucose, Bld: 101 mg/dL — ABNORMAL HIGH (ref 65–99)
Potassium: 4.2 mmol/L (ref 3.5–5.1)
SODIUM: 140 mmol/L (ref 135–145)
Total Protein: 6.2 g/dL — ABNORMAL LOW (ref 6.5–8.1)

## 2017-12-22 LAB — MAGNESIUM: Magnesium: 2.1 mg/dL (ref 1.7–2.4)

## 2017-12-22 MED ORDER — MAGNESIUM CITRATE PO SOLN
0.5000 | Freq: Once | ORAL | Status: AC
  Administered 2017-12-22: 0.5 via ORAL
  Filled 2017-12-22: qty 296

## 2017-12-22 NOTE — Progress Notes (Signed)
[facility] from [facility].   Patient under obs. Patient will return to facility.   LCSW confirmed return with facility.   LCSW faxed dc docs to facility.   RN report#: [id number]  Coralyn HellingBernette Kennette Cuthrell, Norberta KeensLSCW [place] CSW [phone]

## 2017-12-22 NOTE — Progress Notes (Signed)
SATURATION QUALIFICATIONS: (This note is used to comply with regulatory documentation for home oxygen)  Patient Saturations on Room Air at Rest = 95%  Patient Saturations on Room Air while Ambulating = 90 %  Patient Saturations on 0 Liters of oxygen while Ambulating = %  Please briefly explain why patient needs home oxygen: Does not qualify for oxygen

## 2017-12-22 NOTE — Clinical Social Work Note (Signed)
Clinical Social Work Assessment  Patient Details  Name: Christopher Kelley. MRN: [id number] Date of Birth: September 04, 1929  Date of referral:  12/22/17               Reason for consult:  Facility Placement                Permission sought to share information with:  Oceanographeracility Contact Representative Permission granted to share information::  Yes, Verbal Permission Granted  Name::        Agency::  [facility]  Relationship::     Contact Information:     Housing/Transportation Living arrangements for the past 2 months:  Assisted Living Facility Source of Information:  Patient, Facility Patient Interpreter Needed:  None Criminal Activity/Legal Involvement Pertinent to Current Situation/Hospitalization:  No - Comment as needed Significant Relationships:  Adult Children, Merchandiser, retailCommunity Support Lives with:  Facility Resident Do you feel safe going back to the place where you live?  Yes Need for family participation in patient care:  Yes (Comment)  Care giving concerns:  No care giving concerns at time of assessment.    Social Worker assessment / plan:  LCSW following for return to facility.  Patient under observation for a fall at his facility.   [facility]  Patient reports that he has a cane, but seldomly uses it.   Patient will return to facility at dc.   LCSW attempted to reach patient's daughter for collateral.   Employment status:  Retired Health and safety inspectornsurance information:  Medicare PT Recommendations:    Information / Referral to community resources:     Patient/Family's Response to care:  Patient expressed concerns about his frequent urination. LCSW acknowledged patients concerns.   Patient/Family's Understanding of and Emotional Response to Diagnosis, Current Treatment, and Prognosis:  Patient had poor insight into his situation. Patient stated that he was still  In shock from is fall and unsure of what happened.   Emotional Assessment Appearance:  Appears stated  age Attitude/Demeanor/Rapport:    Affect (typically observed):  Apprehensive Orientation:  Oriented to Self, Oriented to Place, Oriented to  Time, Oriented to Situation Alcohol / Substance use:  Not Applicable Psych involvement (Current and /or in the community):  No (Comment)  Discharge Needs  Concerns to be addressed:  No discharge needs identified Readmission within the last 30 days:  No Current discharge risk:  None Barriers to Discharge:  No Barriers Identified   Christopher HellingBernette Makynzie Dobesh, LCSW 12/22/2017, 2:05 PM

## 2017-12-22 NOTE — Discharge Summary (Signed)
Physician Discharge Summary  Christopher Lovern Sr. ZOX:[id number] DOB: 29-May-1929 DOA: 12/21/2017  PCP: Forrest Moron, MD  Admit date: 12/21/2017 Discharge date: 12/22/2017  Admitted From: Assisted living facility Disposition: Assisted living facility  Recommendations for Outpatient Follow-up:  1. Follow up with PCP in 1-2 weeks 2. Please obtain BMP/CBC in one week   Home Health: No Equipment/Devices: None  Discharge Condition: Stable CODE STATUS: DNR Diet recommendation: Heart Healthy  Brief/Interim Summary:  #) Hypoxia: Patient was admitted after fall and noted to be hypoxic in the ED.  CTA for PE showed no evidence of PE but did show chronic lung disease.  Chest x-ray shows showed some atelectasis.  In the morning patient was ambulated and on rest and was not requiring any oxygen.  He was discharged back to his assisted living facility without any oxygen.  #) Dementia: Patient was continued on donepezil 10 mg nightly  #) Pain/psych: Patient was continued on home gabapentin, mirtazapine, meloxicam, lorazepam, tramadol, quetiapine, paroxetine.  #) Hypertension/hyperlipidemia: Patient was continued on aspirin 81 mg, pravastatin 20 mg.  #) Allergies: Patient was continued on cetirizine, montelukast, fluticasone inhaled.  10.)  Bladder spasms: Patient was continued on mirabegron, oxybutynin.  Discharge Diagnoses:  Active Problems:   Hypoxia    Discharge Instructions  Discharge Instructions    Call MD for:  difficulty breathing, headache or visual disturbances   Complete by:  As directed    Call MD for:  hives   Complete by:  As directed    Call MD for:  persistant nausea and vomiting   Complete by:  As directed    Call MD for:  redness, tenderness, or signs of infection (pain, swelling, redness, odor or green/yellow discharge around incision site)   Complete by:  As directed    Call MD for:  severe uncontrolled pain   Complete by:  As directed    Call MD for:   temperature >100.4   Complete by:  As directed    Diet - low sodium heart healthy   Complete by:  As directed    Discharge instructions   Complete by:  As directed    Follow-up with your primary care doctor in 1-2 weeks.   Increase activity slowly   Complete by:  As directed      Allergies as of 12/22/2017      Reactions   Amoxicillin Hives   Doxycycline Rash      Medication List    TAKE these medications   acetaminophen 325 MG tablet Commonly known as:  TYLENOL Take 2 tablets (650 mg total) by mouth every 6 (six) hours as needed. What changed:  reasons to take this   aspirin EC 81 MG tablet Take 81 mg by mouth daily.   azelastine 0.1 % nasal spray Commonly known as:  ASTELIN Place 2 sprays into both nostrils 2 (two) times daily. Use in each nostril as directed   cetirizine 10 MG tablet Commonly known as:  ZYRTEC Take 1 tablet (10 mg total) by mouth daily. What changed:    when to take this  reasons to take this   donepezil 10 MG tablet Commonly known as:  ARICEPT Take 10 mg by mouth at bedtime.   eucerin cream Apply 1 application topically as needed for dry skin.   FLORASTOR 250 MG capsule Generic drug:  saccharomyces boulardii Take 250 mg by mouth 2 (two) times daily. For 10 days.   fluticasone 50 MCG/ACT nasal spray Commonly known as:  FLONASE Place  2 sprays into both nostrils daily.   gabapentin 300 MG capsule Commonly known as:  NEURONTIN Take 300 mg by mouth 3 (three) times daily.   HYDROcodone-acetaminophen 5-325 MG tablet Commonly known as:  NORCO/VICODIN Take 1 tablet by mouth every 6 (six) hours as needed for moderate pain.   ipratropium 0.03 % nasal spray Commonly known as:  ATROVENT Place 2 sprays into both nostrils every 6 (six) hours as needed for rhinitis.   LORazepam 1 MG tablet Commonly known as:  ATIVAN Take 1 mg by mouth 3 (three) times daily.   Melatonin 3 MG Tabs Take 3 mg by mouth at bedtime.   meloxicam 15 MG  tablet Commonly known as:  MOBIC Take 15 mg by mouth daily.   mirtazapine 15 MG tablet Commonly known as:  REMERON Take 30 mg by mouth at bedtime.   montelukast 10 MG tablet Commonly known as:  SINGULAIR Take 1 tablet (10 mg total) by mouth at bedtime.   multivitamin with minerals tablet Take 1 tablet by mouth daily.   MYRBETRIQ 25 MG Tb24 tablet Generic drug:  mirabegron ER Take 25 mg by mouth daily.   oxybutynin 10 MG 24 hr tablet Commonly known as:  DITROPAN-XL Take 10 mg by mouth daily.   PARoxetine 10 MG tablet Commonly known as:  PAXIL Take 10 mg by mouth daily.   polyethylene glycol packet Commonly known as:  MIRALAX / GLYCOLAX Take 17 g by mouth every other day.   pravastatin 20 MG tablet Commonly known as:  PRAVACHOL Take 20 mg by mouth daily.   QUEtiapine 25 MG tablet Commonly known as:  SEROQUEL Take 25 mg by mouth at bedtime.   ranitidine 150 MG tablet Commonly known as:  ZANTAC Take 150 mg by mouth at bedtime.   traMADol 50 MG tablet Commonly known as:  ULTRAM Take 50 mg by mouth 3 (three) times daily.       Allergies  Allergen Reactions  . Amoxicillin Hives  . Doxycycline Rash    Consultations:  None   Procedures/Studies: Dg Chest 2 View  Result Date: 12/21/2017 CLINICAL DATA:  Shortness of breath EXAM: CHEST - 2 VIEW COMPARISON:  11/27/2016 FINDINGS: Cardiac shadow is stable. Pacing device is again seen. Stable coarsened markings are noted within both lungs but particularly in the left lung base. Some new right basilar atelectasis is seen. No bony abnormality is noted. No effusion is seen. IMPRESSION: New right basilar atelectasis. Stable appearance in the left base is seen. Electronically Signed   By: Alcide Clever M.D.   On: 12/21/2017 10:28   Ct Angio Chest Pe W And/or Wo Contrast  Result Date: 12/21/2017 CLINICAL DATA:  Hypoxia following a fall today.  Chronic COPD. EXAM: CT ANGIOGRAPHY CHEST WITH CONTRAST TECHNIQUE: Multidetector  CT imaging of the chest was performed using the standard protocol during bolus administration of intravenous contrast. Multiplanar CT image reconstructions and MIPs were obtained to evaluate the vascular anatomy. CONTRAST:  66mL ISOVUE-370 IOPAMIDOL (ISOVUE-370) INJECTION 76% COMPARISON:  Chest radiographs obtained earlier today and chest CTA dated 04/29/2016. FINDINGS: Cardiovascular: Atheromatous aortic calcifications. Normally opacified pulmonary arteries no pulmonary arterial filling defects seen. Mediastinum/Nodes: No enlarged mediastinal, hilar, or axillary lymph nodes. Thyroid gland, trachea, and esophagus demonstrate no significant findings. Lungs/Pleura: Diffusely prominent interstitial markings with bilateral bullous changes, most pronounced in the periphery of the lungs. Small right upper lobe and left lower lobe calcified granulomata. No pleural fluid. Upper Abdomen: Cholecystectomy clips. Musculoskeletal: Thoracic spine degenerative changes. Review  of the MIP images confirms the above findings. IMPRESSION: 1. No pulmonary emboli or acute abnormality. 2. Changes of COPD with interstitial fibrosis without significant change. 3. Calcified aortic atherosclerosis. Aortic Atherosclerosis (ICD10-I70.0) and Emphysema (ICD10-J43.9). Electronically Signed   By: Beckie Salts M.D.   On: 12/21/2017 14:08       Subjective:   Discharge Exam: Vitals:   12/21/17 2037 12/22/17 0543  BP: 105/71 133/83  Pulse: 70 75  Resp: 20 19  Temp: 98.7 F (37.1 C) 98.2 F (36.8 C)  SpO2: 97% 95%   Vitals:   12/21/17 1400 12/21/17 1454 12/21/17 2037 12/22/17 0543  BP: (!) 153/87 (!) 142/88 105/71 133/83  Pulse: 88 84 70 75  Resp:  19 20 19   Temp:  98.8 F (37.1 C) 98.7 F (37.1 C) 98.2 F (36.8 C)  TempSrc:  Oral  Oral  SpO2: 92% 94% 97% 95%  Weight:    97.5 kg (215 lb)    General: Pt is alert, awake, not in acute distress Cardiovascular: RRR, S1/S2 +, no rubs, no gallops Respiratory: CTA  bilaterally, no wheezing, no rhonchi Abdominal: Soft, NT, ND, bowel sounds + Extremities: no edema    The results of significant diagnostics from this hospitalization (including imaging, microbiology, ancillary and laboratory) are listed below for reference.     Microbiology: No results found for this or any previous visit (from the past 240 hour(s)).   Labs: BNP (last 3 results) Recent Labs    12/21/17 1033  BNP 38.7   Basic Metabolic Panel: Recent Labs  Lab 12/21/17 1033 12/22/17 0549  NA 142 140  K 4.7 4.2  CL 106 107  CO2 26 23  GLUCOSE 109* 101*  BUN 20 21*  CREATININE 1.08 1.01  CALCIUM 9.2 9.1  MG 2.2 2.1   Liver Function Tests: Recent Labs  Lab 12/22/17 0549  AST 21  ALT 16*  ALKPHOS 32*  BILITOT 0.8  PROT 6.2*  ALBUMIN 3.6   No results for input(s): LIPASE, AMYLASE in the last 168 hours. No results for input(s): AMMONIA in the last 168 hours. CBC: Recent Labs  Lab 12/21/17 1033 12/22/17 0549  WBC 9.1 9.4  HGB 16.2 15.8  HCT 50.0 48.4  MCV 95.1 94.2  PLT 175 165   Cardiac Enzymes: No results for input(s): CKTOTAL, CKMB, CKMBINDEX, TROPONINI in the last 168 hours. BNP: Invalid input(s): POCBNP CBG: No results for input(s): GLUCAP in the last 168 hours. D-Dimer Recent Labs    12/21/17 1045  DDIMER 0.41   Hgb A1c No results for input(s): HGBA1C in the last 72 hours. Lipid Profile No results for input(s): CHOL, HDL, LDLCALC, TRIG, CHOLHDL, LDLDIRECT in the last 72 hours. Thyroid function studies No results for input(s): TSH, T4TOTAL, T3FREE, THYROIDAB in the last 72 hours.  Invalid input(s): FREET3 Anemia work up No results for input(s): VITAMINB12, FOLATE, FERRITIN, TIBC, IRON, RETICCTPCT in the last 72 hours. Urinalysis    Component Value Date/Time   COLORURINE YELLOW 10/07/2017 1347   APPEARANCEUR CLEAR 10/07/2017 1347   LABSPEC 1.023 10/07/2017 1347   PHURINE 6.0 10/07/2017 1347   GLUCOSEU NEGATIVE 10/07/2017 1347   HGBUR  NEGATIVE 10/07/2017 1347   BILIRUBINUR NEGATIVE 10/07/2017 1347   KETONESUR 5 (A) 10/07/2017 1347   PROTEINUR NEGATIVE 10/07/2017 1347   NITRITE NEGATIVE 10/07/2017 1347   LEUKOCYTESUR NEGATIVE 10/07/2017 1347   Sepsis Labs Invalid input(s): PROCALCITONIN,  WBC,  LACTICIDVEN Microbiology No results found for this or any previous visit (from the past  240 hour(s)).   Time coordinating discharge: Over 30 minutes  SIGNED:   Delaine LameShrey C Katisha Shimizu, MD  [facility] 12/22/2017, 12:20 PM   If 7PM-7AM, please contact night-coverage [email or web address] Password [email or web address]

## 2017-12-22 NOTE — Progress Notes (Signed)
[facility] Gave report to RN.

## 2017-12-22 NOTE — Discharge Instructions (Signed)

## 2017-12-22 NOTE — Evaluation (Signed)
Physical Therapy Evaluation Patient Details Name: Christopher FreestoneJoseph Belcher Sr. MRN: [id number] DOB: 04/23/1929 Today's Date: 12/22/2017   History of Present Illness  84 yo male admitted with hypoxia, fall. Hx of pacemaker, HF, anxiety  Clinical Impression  On eval, pt was Min guard assist for mobility. He walked ~100 feet without an assistive device. Mildly unsteady at times but no overt LOB. Dyspnea 2/4. O2 sats dropped to 83% on RA during ambulation-made NT aware and requested they consider checking it again prior to d/c. Per chart, nursing documented sats were WNL ranges prior to PT session. Do not anticipate any f/u PT needs. Pt stated he uses assistive devices as needed. Will follow during hospital stay.     Follow Up Recommendations Supervision for mobility/OOB(return to ALF)    Equipment Recommendations  None recommended by PT    Recommendations for Other Services       Precautions / Restrictions        Mobility  Bed Mobility Overal bed mobility: Modified Independent                Transfers Overall transfer level: Modified independent                  Ambulation/Gait Ambulation/Gait assistance: Min guard Ambulation Distance (Feet): 100 Feet Assistive device: None Gait Pattern/deviations: Step-through pattern;Decreased stride length     General Gait Details: Mildly unsteady intermittently but no overt LOB. Dyspnea 2/4. O2 sat reading 83% on RA during ambualtion.   Stairs            Wheelchair Mobility    Modified Rankin (Stroke Patients Only)       Balance Overall balance assessment: Needs assistance           Standing balance-Leahy Scale: Good                               Pertinent Vitals/Pain Pain Assessment: No/denies pain    Home Living Family/patient expects to be discharged to:: Assisted living               Home Equipment: Walker - 2 wheels;Cane - single point      Prior Function Level of Independence:  Independent with assistive device(s)         Comments: pt stated he uses assistive devices PRN     Hand Dominance        Extremity/Trunk Assessment   Upper Extremity Assessment Upper Extremity Assessment: Overall WFL for tasks assessed    Lower Extremity Assessment Lower Extremity Assessment: Generalized weakness    Cervical / Trunk Assessment Cervical / Trunk Assessment: Kyphotic  Communication   Communication: HOH  Cognition Arousal/Alertness: Awake/alert Behavior During Therapy: WFL for tasks assessed/performed Overall Cognitive Status: Within Functional Limits for tasks assessed                                 General Comments: hx of dementia per chart      General Comments      Exercises     Assessment/Plan    PT Assessment Patient needs continued PT services  PT Problem List Decreased mobility;Decreased activity tolerance       PT Treatment Interventions Gait training;Functional mobility training;Therapeutic activities;Patient/family education;Balance training;Therapeutic exercise    PT Goals (Current goals can be found in the Care Plan section)  Acute Rehab PT Goals Patient Stated Goal: home  soon PT Goal Formulation: With patient Time For Goal Achievement: 01/05/18 Potential to Achieve Goals: Good    Frequency Min 3X/week   Barriers to discharge        Co-evaluation               AM-PAC PT "6 Clicks" Daily Activity  Outcome Measure Difficulty turning over in bed (including adjusting bedclothes, sheets and blankets)?: None Difficulty moving from lying on back to sitting on the side of the bed? : None Difficulty sitting down on and standing up from a chair with arms (e.g., wheelchair, bedside commode, etc,.)?: None Help needed moving to and from a bed to chair (including a wheelchair)?: A Little Help needed walking in hospital room?: A Little Help needed climbing 3-5 steps with a railing? : A Little 6 Click Score:  21    End of Session Equipment Utilized During Treatment: Gait belt Activity Tolerance: Patient tolerated treatment well Patient left: in bed;with call bell/phone within reach;with nursing/sitter in room   PT Visit Diagnosis: Muscle weakness (generalized) (M62.81)    Time: 1610-9604 PT Time Calculation (min) (ACUTE ONLY): 9 min   Charges:   PT Evaluation $PT Eval Moderate Complexity: 1 Mod     PT G Codes:          Rebeca Alert, MPT Pager: [phone]

## 2018-05-20 ENCOUNTER — Ambulatory Visit: Payer: Medicare Other | Admitting: Family Medicine

## 2018-05-20 ENCOUNTER — Encounter: Payer: Self-pay | Admitting: Allergy & Immunology

## 2018-05-20 ENCOUNTER — Ambulatory Visit (INDEPENDENT_AMBULATORY_CARE_PROVIDER_SITE_OTHER): Payer: Medicare Other | Admitting: Allergy & Immunology

## 2018-05-20 VITALS — BP 122/60 | HR 76 | Resp 20

## 2018-05-20 DIAGNOSIS — J310 Chronic rhinitis: Secondary | ICD-10-CM

## 2018-05-20 NOTE — Progress Notes (Signed)
FOLLOW UP  Date of Service/Encounter:  05/20/18   Assessment:   Chronic rhinitis (indoor molds)  Plan/Recommendations:   1. Chronic rhinitis (indoor molds only) - Continue with Flonase (fluticasone) two sprays per nostril TWICE DAILY. - Continue with Astelin (azelastine) two sprays per nostril TWICE DAILY.  - Continue with Zyrtec (cetirizine) 10mg  and Singulair (montelukast) 10mg  DAILY.  - These medications need to be used EVERY DAY for the best effect. - Call us if there are problems.   2. Return in about 1 year (around 05/21/2019).  Subjective:   Christopher Kelley Sr. is a 84 y.o. male presenting today for follow up of  Chief Complaint  Patient presents with  . Follow-up  . Nasal Congestion    Christopher Kelley Sr. has a history of the following: Patient Active Problem List   Diagnosis Date Noted  . Hypoxia 12/21/2017  . Chronic rhinitis 09/10/2017    History obtained from: chart review and patient. Unfortunately, there [facility].   Christopher Kelley Sr.'s Primary Care Provider is Christopher Moronuehle, Stephen, MD.     Christopher Kelley is a 84 y.o. male with a history of vascular dementia [facility] for a follow up visit.  He was last seen in March 2019.  At that time, we did do skin testing which was unrevealing.  He did have a 1-2+ reaction to an indoor mold but was otherwise negative.  We continued him on his nasal steroid spray and we added Astelin 2 sprays per nostril twice daily as needed.  We also continued cetirizine and montelukast.  Ipratropium was added at that time as well.  Since the last visit, he has continued to have problems. He reports that in the mornings, he will have marked postnasal drip.  He reports that he goes through 3-4 handkerchiefs every morning.  He is unable to tell me the color of it, as he typically either swallows it or spits it out.  He does not pay attention to the color.  He does deny sinus pressure and fevers.  There  are no particular environments that seem to make it worse.  Currently, he is only using his no sprays once a day.  He is unsure if the nursing staff would be willing to do it twice a day.  He was able to carry has no sprays at one point, but they have confiscated all of his medications.   Otherwise, there have been no changes to his past medical history, surgical history, family history, or social history.    Review of Systems: a 14-point review of systems is pertinent for what is mentioned in HPI.  Otherwise, all other systems were negative. Constitutional: negative other than that listed in the HPI Eyes: negative other than that listed in the HPI Ears, nose, mouth, throat, and face: negative other than that listed in the HPI Respiratory: negative other than that listed in the HPI Cardiovascular: negative other than that listed in the HPI Gastrointestinal: negative other than that listed in the HPI Genitourinary: negative other than that listed in the HPI Integument: negative other than that listed in the HPI Hematologic: negative other than that listed in the HPI Musculoskeletal: negative other than that listed in the HPI Neurological: negative other than that listed in the HPI Allergy/Immunologic: negative other than that listed in the HPI    Objective:   Blood pressure 122/60, pulse 76, resp. rate 20, SpO2 98 %. There is no height or weight  on file to calculate BMI.   Physical Exam:  General: Alert, interactive, in no acute distress. Talkative. Eyes: No conjunctival injection bilaterally, no discharge on the right, no discharge on the left and no Horner-Trantas dots present. PERRL bilaterally. EOMI without pain. No photophobia.  Ears: Hearing aids bilaterally, Right TM pearly gray with normal light reflex, Left TM pearly gray with normal light reflex, Right TM intact without perforation and Left TM intact without perforation.  Nose/Throat: External nose within normal limits and  septum midline. Turbinates mildly edematous with clear discharge. Posterior oropharynx erythematous without cobblestoning in the posterior oropharynx. Tonsils 2+ without exudates.  Tongue without thrush. Lungs: Clear to auscultation without wheezing, rhonchi or rales. No increased work of breathing. CV: Normal S1/S2. No murmurs. Capillary refill <2 seconds.  Skin: Warm and dry, without lesions or rashes. Neuro:   Grossly intact. No focal deficits appreciated. Responsive to questions.  Diagnostic studies: none      Malachi Bonds, MD  [facility]

## 2018-05-20 NOTE — Patient Instructions (Addendum)
1. Chronic rhinitis (indoor molds only) - Continue with Flonase (fluticasone) two sprays per nostril TWICE DAILY. - Continue with Astelin (azelastine) two sprays per nostril TWICE DAILY.  - Continue with Zyrtec (cetirizine) 10mg  and Singulair (montelukast) 10mg  DAILY.  - These medications need to be used EVERY DAY for the best effect. - Call us if there are problems.   2. Return in about 1 year (around 05/21/2019).  Please inform us of any Emergency Department visits, hospitalizations, or changes in symptoms. Call us before going to the ED for breathing or allergy symptoms since we might be able to fit you in for a sick visit. Feel free to contact us anytime with any questions, problems, or concerns.  It was a pleasure to see you again today!  Websites that have reliable patient information: 1. American Academy of Asthma, Allergy, and Immunology: www.aaaai.org 2. Food Allergy Research and Education (FARE): foodallergy.org 3. Mothers of Asthmatics: http://www.asthmacommunitynetwork.org 4. American College of Allergy, Asthma, and Immunology: MissingWeapons.cawww.acaai.org   Make sure you are registered to vote! If you have moved or changed any of your contact information, you will need to get this updated before voting!

## 2018-06-20 ENCOUNTER — Encounter (HOSPITAL_COMMUNITY): Payer: Self-pay

## 2018-06-20 ENCOUNTER — Other Ambulatory Visit: Payer: Self-pay

## 2018-06-20 ENCOUNTER — Emergency Department (HOSPITAL_COMMUNITY): Payer: Medicare Other

## 2018-06-20 ENCOUNTER — Emergency Department (HOSPITAL_COMMUNITY)
Admission: EM | Admit: 2018-06-20 | Discharge: 2018-06-20 | Disposition: A | Discharge status: Home / Self Care | Payer: Medicare Other | Attending: Emergency Medicine | Admitting: Emergency Medicine

## 2018-06-20 DIAGNOSIS — Z79899 Other long term (current) drug therapy: Secondary | ICD-10-CM | POA: Insufficient documentation

## 2018-06-20 DIAGNOSIS — I509 Heart failure, unspecified: Secondary | ICD-10-CM | POA: Diagnosis not present

## 2018-06-20 DIAGNOSIS — Z87891 Personal history of nicotine dependence: Secondary | ICD-10-CM | POA: Diagnosis not present

## 2018-06-20 DIAGNOSIS — I110 Hypertensive heart disease with heart failure: Secondary | ICD-10-CM | POA: Insufficient documentation

## 2018-06-20 DIAGNOSIS — R350 Frequency of micturition: Secondary | ICD-10-CM | POA: Diagnosis present

## 2018-06-20 DIAGNOSIS — R1030 Lower abdominal pain, unspecified: Secondary | ICD-10-CM | POA: Insufficient documentation

## 2018-06-20 DIAGNOSIS — F419 Anxiety disorder, unspecified: Secondary | ICD-10-CM | POA: Diagnosis not present

## 2018-06-20 DIAGNOSIS — Z7982 Long term (current) use of aspirin: Secondary | ICD-10-CM | POA: Diagnosis not present

## 2018-06-20 DIAGNOSIS — Z950 Presence of cardiac pacemaker: Secondary | ICD-10-CM | POA: Diagnosis not present

## 2018-06-20 DIAGNOSIS — Z9049 Acquired absence of other specified parts of digestive tract: Secondary | ICD-10-CM | POA: Diagnosis not present

## 2018-06-20 DIAGNOSIS — F329 Major depressive disorder, single episode, unspecified: Secondary | ICD-10-CM | POA: Insufficient documentation

## 2018-06-20 LAB — COMPREHENSIVE METABOLIC PANEL
ALBUMIN: 3.9 g/dL (ref 3.5–5.0)
ALK PHOS: 50 U/L (ref 38–126)
ALT: 17 U/L (ref 0–44)
AST: 23 U/L (ref 15–41)
Anion gap: 10 (ref 5–15)
BUN: 27 mg/dL — ABNORMAL HIGH (ref 8–23)
CALCIUM: 9.6 mg/dL (ref 8.9–10.3)
CO2: 28 mmol/L (ref 22–32)
CREATININE: 1.45 mg/dL — AB (ref 0.61–1.24)
Chloride: 109 mmol/L (ref 98–111)
GFR calc Af Amer: 48 mL/min — ABNORMAL LOW (ref >60)
GFR calc non Af Amer: 42 mL/min — ABNORMAL LOW (ref >60)
GLUCOSE: 101 mg/dL — AB (ref 70–99)
Potassium: 4.6 mmol/L (ref 3.5–5.1)
Sodium: 147 mmol/L — ABNORMAL HIGH (ref 135–145)
Total Bilirubin: 0.6 mg/dL (ref 0.3–1.2)
Total Protein: 6.7 g/dL (ref 6.5–8.1)

## 2018-06-20 LAB — CBC WITH DIFFERENTIAL/PLATELET
Abs Immature Granulocytes: 0.05 10*3/uL (ref 0.00–0.07)
Basophils Absolute: 0.1 10*3/uL (ref 0.0–0.1)
Basophils Relative: 1 %
EOS PCT: 4 %
Eosinophils Absolute: 0.3 10*3/uL (ref 0.0–0.5)
HEMATOCRIT: 49.8 % (ref 39.0–52.0)
HEMOGLOBIN: 15.9 g/dL (ref 13.0–17.0)
Immature Granulocytes: 1 %
LYMPHS ABS: 2 10*3/uL (ref 0.7–4.0)
Lymphocytes Relative: 24 %
MCH: 30.2 pg (ref 26.0–34.0)
MCHC: 31.9 g/dL (ref 30.0–36.0)
MCV: 94.5 fL (ref 80.0–100.0)
MONO ABS: 0.7 10*3/uL (ref 0.1–1.0)
Monocytes Relative: 9 %
Neutro Abs: 5.3 10*3/uL (ref 1.7–7.7)
Neutrophils Relative %: 61 %
Platelets: 194 10*3/uL (ref 150–400)
RBC: 5.27 MIL/uL (ref 4.22–5.81)
RDW: 14 % (ref 11.5–15.5)
WBC: 8.5 10*3/uL (ref 4.0–10.5)
nRBC: 0 % (ref 0.0–0.2)

## 2018-06-20 LAB — URINALYSIS, ROUTINE W REFLEX MICROSCOPIC
Bilirubin Urine: NEGATIVE
Glucose, UA: NEGATIVE mg/dL
HGB URINE DIPSTICK: NEGATIVE
KETONES UR: NEGATIVE mg/dL
LEUKOCYTES UA: NEGATIVE
Nitrite: NEGATIVE
PROTEIN: NEGATIVE mg/dL
Specific Gravity, Urine: 1.009 (ref 1.005–1.030)
pH: 7 (ref 5.0–8.0)

## 2018-06-20 LAB — LIPASE, BLOOD: Lipase: 35 U/L (ref 11–51)

## 2018-06-20 NOTE — Discharge Instructions (Signed)
Make sure to drink plenty of fluids and stay hydrated.  Your kidney function was slightly elevated today.  Please have your doctor repeat this lab work in 1 week to make sure it is not getting worse.  Return to the emergency department for any pain, fever, vomiting or any other worsening or concerning symptoms.

## 2018-06-20 NOTE — ED Notes (Signed)
[facility] called for transport.  

## 2018-06-20 NOTE — ED Provider Notes (Signed)
[facility]-EMERGENCY DEPT Provider Note   CSN: [id number] Arrival date & time: 06/20/18  1604     History   Chief Complaint Chief Complaint  Patient presents with  . Dysuria  . Groin Pain  . Leg Pain    HPI Christopher Kelley. is a 84 y.o. male brought in by EMS from [facility] assisted living presenting for evaluation of frequent urination, groin pain x4 days.  Patient states that he has been urinating multiple times a day for the last 4 days.  He also reports some discomfort in his groin and testicle particularly on the right side with urination.  He states he has not noticed any blood in his urine.  Patient states that he normally has loose stools and denies any changes.  He also reports back pain that lower back pain that radiates into his left leg.  He has not had any numbness/weakness.  Patient states he has not had any falls, trauma, injury.  He reports that he did have some lower abdominal discomfort but cannot tell me much more about it.  He feels like he is nauseous but has not had any vomiting.  Patient denies any fevers, chest pain, shortness of breath, cough.   The history is provided by the patient.    Past Medical History:  Diagnosis Date  . Acid reflux   . Anxiety   . AV block   . BPH (benign prostatic hyperplasia)   . CHF (congestive heart failure) (HCC)   . Depression   . High cholesterol   . Hypertension   . Hypoxia   . Pacemaker     Patient Active Problem List   Diagnosis Date Noted  . Hypoxia 12/21/2017  . Chronic rhinitis 09/10/2017    Past Surgical History:  Procedure Laterality Date  . CHOLECYSTECTOMY          Home Medications    Prior to Admission medications   Medication Sig Start Date End Date Taking? Authorizing Provider  aspirin EC 81 MG tablet Take 81 mg by mouth daily.   Yes [provider]  azelastine (ASTELIN) 0.1 % nasal spray Place 2 sprays into both nostrils 2 (two) times daily. Use in each  nostril as directed 11/12/17  Yes Ambs, Norvel Richards, FNP  cetirizine (ZYRTEC) 10 MG tablet Take 1 tablet (10 mg total) by mouth daily. 09/10/17  Yes Alfonse Spruce, MD  Cranberry 450 MG TABS Take 450 mg by mouth daily.   Yes [provider]  donepezil (ARICEPT) 10 MG tablet Take 10 mg by mouth at bedtime.   Yes [provider]  fluticasone (FLONASE) 50 MCG/ACT nasal spray Place 2 sprays into both nostrils daily. 09/10/17  Yes Alfonse Spruce, MD  gabapentin (NEURONTIN) 300 MG capsule Take 300 mg by mouth 3 (three) times daily.   Yes [provider]  HYDROcodone-acetaminophen (NORCO/VICODIN) 5-325 MG tablet Take 1 tablet by mouth every 6 (six) hours as needed for moderate pain.   Yes [provider]  hydroxypropyl methylcellulose / hypromellose (ISOPTO TEARS / GONIOVISC) 2.5 % ophthalmic solution Place 2 drops into both eyes daily.   Yes [provider]  ipratropium (ATROVENT) 0.03 % nasal spray Place 2 sprays into both nostrils every 6 (six) hours as needed for rhinitis. 09/10/17  Yes Alfonse Spruce, MD  LORazepam (ATIVAN) 1 MG tablet Take 1 mg by mouth 3 (three) times daily.    Yes [provider]  Melatonin 3 MG TABS Take  3 mg by mouth at bedtime.    Yes [provider]  meloxicam (MOBIC) 15 MG tablet Take 15 mg by mouth daily.   Yes [provider]  mirtazapine (REMERON) 15 MG tablet Take 30 mg by mouth at bedtime.    Yes [provider]  montelukast (SINGULAIR) 10 MG tablet Take 1 tablet (10 mg total) by mouth at bedtime. 09/10/17  Yes Alfonse Spruce, MD  Multiple Vitamins-Minerals (MULTIVITAMIN WITH MINERALS) tablet Take 1 tablet by mouth daily.   Yes [provider]  MYRBETRIQ 25 MG TB24 tablet Take 25 mg by mouth daily. 12/18/17  Yes [provider]  PARoxetine (PAXIL) 10 MG tablet Take 20 mg by mouth daily.    Yes [provider]  polyethylene glycol (MIRALAX / GLYCOLAX)  packet Take 17 g by mouth daily.    Yes [provider]  QUEtiapine (SEROQUEL) 25 MG tablet Take 25 mg by mouth 2 (two) times daily.    Yes [provider]  ranitidine (ZANTAC) 150 MG tablet Take 150 mg by mouth at bedtime.    Yes [provider]  traMADol (ULTRAM) 50 MG tablet Take 50 mg by mouth 3 (three) times daily.    Yes [provider]  acetaminophen (TYLENOL) 325 MG tablet Take 2 tablets (650 mg total) by mouth every 6 (six) hours as needed. 03/01/17   Barrett Henle, PA-C  Skin Protectants, Misc. (EUCERIN) cream Apply 1 application topically as needed for dry skin.     [provider]    Family History History reviewed. No pertinent family history.  Social History Social History   Tobacco Use  . Smoking status: Former Games developer  . Smokeless tobacco: Never Used  Substance Use Topics  . Alcohol use: No  . Drug use: No     Allergies   Amoxicillin and Doxycycline   Review of Systems Review of Systems  Constitutional: Negative for fever.  HENT: Negative for congestion.   Respiratory: Negative for cough and shortness of breath.   Cardiovascular: Negative for chest pain.  Gastrointestinal: Positive for abdominal pain and nausea. Negative for diarrhea and vomiting.  Genitourinary: Positive for frequency and testicular pain. Negative for dysuria and hematuria.  Musculoskeletal: Negative for back pain (Chronic) and neck pain.  Skin: Negative for rash.  Neurological: Negative for dizziness, weakness, numbness and headaches.  Psychiatric/Behavioral: Negative for confusion.  All other systems reviewed and are negative.    Physical Exam Updated Vital Signs BP (!) 153/88   Pulse 86   Temp 98.1 F (36.7 C) (Oral)   Resp 17   Ht 5\' 7"  (1.702 m)   Wt 90.7 kg   SpO2 92%   BMI 31.32 kg/m   Physical Exam  Constitutional: He is oriented to person, place, and time. He appears well-developed and well-nourished.  HENT:    Head: Normocephalic and atraumatic.  Mouth/Throat: Oropharynx is clear and moist and mucous membranes are normal.  Eyes: Pupils are equal, round, and reactive to light. Conjunctivae, EOM and lids are normal.  Neck: Full passive range of motion without pain.  Cardiovascular: Normal rate, regular rhythm, normal heart sounds and normal pulses. Exam reveals no gallop and no friction rub.  No murmur heard. Pulmonary/Chest: Effort normal and breath sounds normal.  Lungs clear to auscultation bilaterally.  Symmetric chest rise.  No wheezing, rales, rhonchi.  Speaking in full sentences without difficulty.  He is currently on 2 L O2.  He has hypoxia at baseline.  Abdominal:  Soft. Normal appearance. There is tenderness in the suprapubic area. There is no rigidity, no guarding and no CVA tenderness. No hernia.  Abdomen is soft, nondistended.  Tenderness noted to the suprapubic region.  Genitourinary: Penis normal. Right testis shows tenderness. Right testis shows no mass. Left testis shows no mass and no tenderness.  Genitourinary Comments: The exam was performed with a chaperone present. Tenderness palpation of the right testicle some overlying erythema.  No overlying warmth. Digital Rectal Exam reveals sphincter with good tone. No external hemorrhoids. No masses or fissures. Stool color is brown with no overt blood.  Small area of erythema noted to the sacrum.  No evidence of decubitus ulcer.  Musculoskeletal: Normal range of motion.  Neurological: He is alert and oriented to person, place, and time.  Follows commands, Moves all extremities  5/5 strength to BUE and BLE  Sensation intact throughout all major nerve distributions Positive SLR on the left  Skin: Skin is warm and dry. Capillary refill takes less than 2 seconds.  Psychiatric: He has a normal mood and affect. His speech is normal.  Nursing note and vitals reviewed.    ED Treatments / Results  Labs (all labs ordered are listed, but only  abnormal results are displayed) Labs Reviewed  COMPREHENSIVE METABOLIC PANEL - Abnormal; Notable for the following components:      Result Value   Sodium 147 (*)    Glucose, Bld 101 (*)    BUN 27 (*)    Creatinine, Ser 1.45 (*)    GFR calc non Af Amer 42 (*)    GFR calc Af Amer 48 (*)    All other components within normal limits  LIPASE, BLOOD  CBC WITH DIFFERENTIAL/PLATELET  URINALYSIS, ROUTINE W REFLEX MICROSCOPIC    EKG None  Radiology Ct Renal Stone Study  Result Date: 06/20/2018 CLINICAL DATA:  Flank pain.  Polyuria.  Groin pain. EXAM: CT ABDOMEN AND PELVIS WITHOUT CONTRAST TECHNIQUE: Multidetector CT imaging of the abdomen and pelvis was performed following the standard protocol without IV contrast. COMPARISON:  09/01/2015 CT abdomen/pelvis FINDINGS: Lower chest: Patchy confluent subpleural reticulation and ground-glass attenuation at both lung bases with associated traction bronchiectasis. Centrilobular and paraseptal emphysema. Pacer lead tips are seen in the right atrium, right ventricular apex and coronary sinus. Hepatobiliary: Normal liver size. No liver mass. Cholecystectomy. No biliary ductal dilatation. Pancreas: Normal, with no mass or duct dilation. Spleen: Normal size. No mass. Adrenals/Urinary Tract: Normal adrenals. No renal stones. No hydronephrosis. Simple 2.3 cm interpolar right renal cyst. No additional contour deforming renal lesions. Normal caliber ureters, with no ureteral stones. Normal bladder. Stomach/Bowel: Normal non-distended stomach. Normal caliber small bowel with no small bowel wall thickening. Normal appendix. Marked sigmoid diverticulosis, with no large bowel wall thickening or significant pericolonic fat stranding. Vascular/Lymphatic: Atherosclerotic abdominal aorta with stable ectatic 2.5 cm infrarenal abdominal aorta. No pathologically enlarged lymph nodes in the abdomen or pelvis. Reproductive: Moderate prostatomegaly with nonspecific internal prostatic  calcifications, unchanged. Other: No pneumoperitoneum, ascites or focal fluid collection. Musculoskeletal: No aggressive appearing focal osseous lesions. Marked lumbar spondylosis. IMPRESSION: 1. No acute abnormality.  No urolithiasis.  No hydronephrosis. 2. No evidence of bowel obstruction or acute bowel inflammation. Marked sigmoid diverticulosis, no evidence of acute diverticulitis. 3. Spectrum of findings at the lung bases suggestive of fibrotic interstitial lung disease. Outpatient high-resolution chest CT may be obtained as clinically warranted. 4. Moderately enlarged prostate. 5. Aortic Atherosclerosis (ICD10-I70.0) and Emphysema (ICD10-J43.9). Electronically Signed   By: Tomasa Hose  Poff M.D.   On: 06/20/2018 19:23   US Scrotum W/doppler  Result Date: 06/20/2018 CLINICAL DATA:  Scrotal pain. EXAM: SCROTAL ULTRASOUND DOPPLER ULTRASOUND OF THE TESTICLES TECHNIQUE: Complete ultrasound examination of the testicles, epididymis, and other scrotal structures was performed. Color and spectral Doppler ultrasound were also utilized to evaluate blood flow to the testicles. COMPARISON:  None. FINDINGS: Right testicle Measurements: 4.4 x 2.6 x 3.0 cm. No mass or microlithiasis visualized. Dilatation of the rete testis. Left testicle Measurements: 3.0 x 1.7 x 2.0 cm. There are several simple appearing, well-defined anechoic lesions with posterior acoustic enhancement and no internal vascularity measuring up to 9 x 8 x 10 mm. No microlithiasis visualized. There is a 6 mm calcification in the left lateral scrotal wall, likely a scrotolith. Right epididymis:  Normal in size and appearance. Left epididymis: Normal in size and appearance. Several small epididymal head cysts. Hydrocele:  Trace bilateral hydroceles. Varicocele:  None visualized. Pulsed Doppler interrogation of both testes demonstrates normal low resistance arterial and venous waveforms bilaterally. IMPRESSION: 1. No acute abnormality. 2. Several simple cysts  versus spermatoceles in the left testicle. Electronically Signed   By: Obie Dredge M.D.   On: 06/20/2018 17:49    Procedures Procedures (including critical care time)  Medications Ordered in ED Medications - No data to display   Initial Impression / Assessment and Plan / ED Course  I have reviewed the triage vital signs and the nursing notes.  Pertinent labs & imaging results that were available during my care of the patient were reviewed by me and considered in my medical decision making (see chart for details).      84 y.o. male with past history of chronic back pain, urinary incontinence, and by EMS for evaluation of frequent urination.  Patient reports that he feels like he has been going more frequently.  No pain urination.  Does report some right testicular pain.  No recent trauma, injury, fall.  Has not noticed any hematuria or discharge.  Also reports some lower back pain.  He has a history of chronic lower back pain but states that it radiates down to his left leg.  No numbness/weakness.  He has not had any bowel incontinence, saddle anesthesia. Patient is afebrile, non-toxic appearing, sitting comfortably on examination table. Vital signs reviewed.  Patient initially was hypoxic when he first came to the ED.  He was placed on 2 L O2 which bumped up his O2 sats.  This is patient's baseline.  He is a history of hypoxia and is currently on oxygen at home.  Patient is moving all extremities without any difficulty.  Normal rectal tone.  Do not suspect cauda equina.  He has small area of erythema noted to the sacrum.  No evidence of ulcer.  No tenderness noted on rectal exam that would be concerning for rectal abscess.  Discussed with Charmaine, RN at [facility] where patient is a resident.  She states that he normally has frequent urination and urinary incontinence.  She states that his back pain is chronic and is not new.  She states that he called EMS because he was having groin pain and  rectal pain.  She states that he has not had any fevers, vomiting, abdominal pain.  She denies any recent falls or blood in stools.  CMP shows sodium of 147.  BUN is 27 and creatinine is 1.45.  This is a slight elevation from baseline is normally he has BUN 20 and creatinine of 1.01.  Lipase unremarkable.  UA negative for any infectious etiology.  CBC shows no leukocytosis, anemia.  Ultrasound shows no acute abnormality.  Discussed with Dr. Freida Busman.  We will plan for CT renal study for evaluation of any acute abnormality.  CT renal study shows no evidence of acute abnormality.  No evidence of bowel obstruction or acute inflammation.  Patient stable for discharge at this time.  Strict return precautions discussed. Patient expresses understanding and agreement to plan.    Final Clinical Impressions(s) / ED Diagnoses   Final diagnoses:  Urinary frequency    ED Discharge Orders    None       Rosana Hoes 06/20/18 2305    Lorre Nick, MD 06/22/18 [phone]

## 2018-06-20 NOTE — ED Notes (Signed)
Bed: ZO10 Expected date:  Expected time:  Means of arrival:  Comments: 84 yo frequent urination, groin pain

## 2018-06-20 NOTE — ED Notes (Signed)
[facility] at bedside for transport.  

## 2018-06-20 NOTE — ED Provider Notes (Signed)
Medical screening examination/treatment/procedure(s) were conducted as a shared visit with non-physician practitioner(s) and myself.  I personally evaluated the patient during the encounter.  None 84 year old male presents with urinary incontinence times several days.  Denies any dysuria.  Work-up here only shows a mildly elevated creatinine.  Urinalysis without infection.  He had testicular discomfort which was evaluated by an ultrasound as well as a CT scan all of which came back negative.  Patient instructed to follow-up with his doctor for repeat creatinine this week   Lorre Nick, MD 06/20/18 2007

## 2018-06-20 NOTE — ED Triage Notes (Signed)
EMS reports from [facility] on [address], Pt has been experiencing polyuria and groin pain x 4 days intermittently. Pain radiates to back and down left leg. Pt reports no hematuria or pain while urinating or abdominal pain.  BP 146/80 HR 90 RR 16 Sp02 96 RA CBG 120

## 2018-06-28 ENCOUNTER — Emergency Department (HOSPITAL_COMMUNITY)
Admission: EM | Admit: 2018-06-28 | Discharge: 2018-06-28 | Disposition: A | Discharge status: Home / Self Care | Payer: Medicare Other | Attending: Emergency Medicine | Admitting: Emergency Medicine

## 2018-06-28 DIAGNOSIS — R1030 Lower abdominal pain, unspecified: Secondary | ICD-10-CM | POA: Insufficient documentation

## 2018-06-28 DIAGNOSIS — Z950 Presence of cardiac pacemaker: Secondary | ICD-10-CM | POA: Diagnosis not present

## 2018-06-28 DIAGNOSIS — Z79899 Other long term (current) drug therapy: Secondary | ICD-10-CM | POA: Insufficient documentation

## 2018-06-28 DIAGNOSIS — Z87891 Personal history of nicotine dependence: Secondary | ICD-10-CM | POA: Insufficient documentation

## 2018-06-28 DIAGNOSIS — I509 Heart failure, unspecified: Secondary | ICD-10-CM | POA: Insufficient documentation

## 2018-06-28 DIAGNOSIS — Z7982 Long term (current) use of aspirin: Secondary | ICD-10-CM | POA: Diagnosis not present

## 2018-06-28 DIAGNOSIS — I110 Hypertensive heart disease with heart failure: Secondary | ICD-10-CM | POA: Insufficient documentation

## 2018-06-28 LAB — CBC WITH DIFFERENTIAL/PLATELET
Abs Immature Granulocytes: 0.07 10*3/uL (ref 0.00–0.07)
BASOS ABS: 0.1 10*3/uL (ref 0.0–0.1)
Basophils Relative: 1 %
EOS ABS: 0.3 10*3/uL (ref 0.0–0.5)
EOS PCT: 3 %
HEMATOCRIT: 49.6 % (ref 39.0–52.0)
Hemoglobin: 16 g/dL (ref 13.0–17.0)
IMMATURE GRANULOCYTES: 1 %
LYMPHS ABS: 2.2 10*3/uL (ref 0.7–4.0)
LYMPHS PCT: 21 %
MCH: 30.5 pg (ref 26.0–34.0)
MCHC: 32.3 g/dL (ref 30.0–36.0)
MCV: 94.5 fL (ref 80.0–100.0)
Monocytes Absolute: 0.8 10*3/uL (ref 0.1–1.0)
Monocytes Relative: 7 %
NRBC: 0 % (ref 0.0–0.2)
Neutro Abs: 7.2 10*3/uL (ref 1.7–7.7)
Neutrophils Relative %: 67 %
Platelets: 181 10*3/uL (ref 150–400)
RBC: 5.25 MIL/uL (ref 4.22–5.81)
RDW: 13.8 % (ref 11.5–15.5)
WBC: 10.6 10*3/uL — ABNORMAL HIGH (ref 4.0–10.5)

## 2018-06-28 LAB — COMPREHENSIVE METABOLIC PANEL
ALK PHOS: 44 U/L (ref 38–126)
ALT: 20 U/L (ref 0–44)
AST: 27 U/L (ref 15–41)
Albumin: 4 g/dL (ref 3.5–5.0)
Anion gap: 7 (ref 5–15)
BILIRUBIN TOTAL: 0.7 mg/dL (ref 0.3–1.2)
BUN: 28 mg/dL — ABNORMAL HIGH (ref 8–23)
CALCIUM: 9 mg/dL (ref 8.9–10.3)
CO2: 23 mmol/L (ref 22–32)
Chloride: 109 mmol/L (ref 98–111)
Creatinine, Ser: 1.26 mg/dL — ABNORMAL HIGH (ref 0.61–1.24)
GFR calc Af Amer: 57 mL/min — ABNORMAL LOW (ref >60)
GFR, EST NON AFRICAN AMERICAN: 49 mL/min — AB (ref >60)
GLUCOSE: 94 mg/dL (ref 70–99)
POTASSIUM: 4.2 mmol/L (ref 3.5–5.1)
Sodium: 139 mmol/L (ref 135–145)
Total Protein: 7 g/dL (ref 6.5–8.1)

## 2018-06-28 LAB — URINALYSIS, ROUTINE W REFLEX MICROSCOPIC
BILIRUBIN URINE: NEGATIVE
Glucose, UA: NEGATIVE mg/dL
HGB URINE DIPSTICK: NEGATIVE
KETONES UR: NEGATIVE mg/dL
Leukocytes, UA: NEGATIVE
Nitrite: NEGATIVE
PROTEIN: NEGATIVE mg/dL
Specific Gravity, Urine: 1.012 (ref 1.005–1.030)
pH: 5 (ref 5.0–8.0)

## 2018-06-28 LAB — LIPASE, BLOOD: Lipase: 28 U/L (ref 11–51)

## 2018-06-28 LAB — I-STAT CG4 LACTIC ACID, ED
LACTIC ACID, VENOUS: 1.03 mmol/L (ref 0.5–1.9)
LACTIC ACID, VENOUS: 1.03 mmol/L (ref 0.5–1.9)

## 2018-06-28 NOTE — ED Notes (Signed)
[facility] called at this time.

## 2018-06-28 NOTE — ED Notes (Signed)
Bed: [facility] Expected date:  Expected time:  Means of arrival:  Comments: EMS 84yo back pain

## 2018-06-28 NOTE — ED Notes (Signed)
Bladder scan: 79mL 

## 2018-06-28 NOTE — ED Triage Notes (Signed)
Transported by [facility] from [facility] on [address]-- onset today groin pain with urination. AAO x 3--baseline. Low oxygen saturations also in route with EMS.

## 2018-06-28 NOTE — ED Notes (Signed)
[facility]  Called at this time 

## 2018-06-28 NOTE — Discharge Instructions (Signed)
1.  Schedule an appointment with [facility] as soon as possible.  You should see a specialist for the scrotal pain that you are experiencing.  Your prostate is slightly enlarged and needs further evaluation by a urologist. 2.  Try to be seen by your family doctor for recheck this week if you cannot be seen by a urologist within the next 3 to 7 days. 3.  Continue to take a stool softener and/or MiraLAX to avoid any constipation.

## 2018-06-28 NOTE — ED Provider Notes (Signed)
[facility]-EMERGENCY DEPT Provider Note   CSN: [id number] Arrival date & time: 06/28/18  1703     History   Chief Complaint Chief Complaint  Patient presents with  . Groin Pain    HPI Christopher Culp Sr. is a 84 y.o. male.  HPI Patient reports he continues to have discomfort of his lower abdomen.  He indicates with his hands bilaterally the inguinal region.  He reports sometimes he seems to have a discomfort in the scrotum that descends into his abdomen.  There are times he indicates the entirety of his central abdomen and reports he gets nausea.  He reports he has been taking a stool softener to deal with constipation.  He reports he is having regular bowel movements.  He reports he feels like there is some burning when he urinates.  Discomfort is about the same as it was several days ago when he was seen in the emergency department.  No significant change. Past Medical History:  Diagnosis Date  . Acid reflux   . Anxiety   . AV block   . BPH (benign prostatic hyperplasia)   . CHF (congestive heart failure) (HCC)   . Depression   . High cholesterol   . Hypertension   . Hypoxia   . Pacemaker     Patient Active Problem List   Diagnosis Date Noted  . Hypoxia 12/21/2017  . Chronic rhinitis 09/10/2017    Past Surgical History:  Procedure Laterality Date  . CHOLECYSTECTOMY          Home Medications    Prior to Admission medications   Medication Sig Start Date End Date Taking? Authorizing Provider  aspirin EC 81 MG tablet Take 81 mg by mouth daily.   Yes [provider]  azelastine (ASTELIN) 0.1 % nasal spray Place 2 sprays into both nostrils 2 (two) times daily. Use in each nostril as directed 11/12/17  Yes Ambs, Norvel Richards, FNP  cetirizine (ZYRTEC) 10 MG tablet Take 1 tablet (10 mg total) by mouth daily. 09/10/17  Yes Alfonse Spruce, MD  Cranberry 450 MG TABS Take 450 mg by mouth daily.   Yes [provider]  donepezil (ARICEPT)  10 MG tablet Take 10 mg by mouth at bedtime.   Yes [provider]  fluticasone (FLONASE) 50 MCG/ACT nasal spray Place 2 sprays into both nostrils daily. 09/10/17  Yes Alfonse Spruce, MD  gabapentin (NEURONTIN) 300 MG capsule Take 300 mg by mouth 3 (three) times daily.   Yes [provider]  HYDROcodone-acetaminophen (NORCO/VICODIN) 5-325 MG tablet Take 1 tablet by mouth every 6 (six) hours as needed for moderate pain.   Yes [provider]  hydroxypropyl methylcellulose / hypromellose (ISOPTO TEARS / GONIOVISC) 2.5 % ophthalmic solution Place 2 drops into both eyes daily.   Yes [provider]  ipratropium (ATROVENT) 0.03 % nasal spray Place 2 sprays into both nostrils every 6 (six) hours as needed for rhinitis. 09/10/17  Yes Alfonse Spruce, MD  LORazepam (ATIVAN) 1 MG tablet Take 1 mg by mouth 3 (three) times daily.    Yes [provider]  Melatonin 3 MG TABS Take 3 mg by mouth at bedtime.    Yes [provider]  meloxicam (MOBIC) 15 MG tablet Take 15 mg by mouth daily.   Yes [provider]  mirtazapine (REMERON) 15 MG tablet Take 30 mg by mouth at bedtime.    Yes [provider]  montelukast (SINGULAIR) 10 MG tablet  Take 1 tablet (10 mg total) by mouth at bedtime. 09/10/17  Yes Alfonse Spruce, MD  Multiple Vitamins-Minerals (MULTIVITAMIN WITH MINERALS) tablet Take 1 tablet by mouth daily.   Yes [provider]  MYRBETRIQ 25 MG TB24 tablet Take 25 mg by mouth daily. 12/18/17  Yes [provider]  PARoxetine (PAXIL) 10 MG tablet Take 20 mg by mouth daily.    Yes [provider]  polyethylene glycol (MIRALAX / GLYCOLAX) packet Take 17 g by mouth daily.    Yes [provider]  QUEtiapine (SEROQUEL) 25 MG tablet Take 25 mg by mouth 2 (two) times daily.    Yes [provider]  ranitidine (ZANTAC) 150 MG tablet Take 150 mg by mouth at bedtime.    Yes [provider]   traMADol (ULTRAM) 50 MG tablet Take 50 mg by mouth 3 (three) times daily.    Yes [provider]  acetaminophen (TYLENOL) 325 MG tablet Take 2 tablets (650 mg total) by mouth every 6 (six) hours as needed. 03/01/17   Barrett Henle, PA-C  Skin Protectants, Misc. (EUCERIN) cream Apply 1 application topically as needed for dry skin.     [provider]    Family History No family history on file.  Social History Social History   Tobacco Use  . Smoking status: Former Games developer  . Smokeless tobacco: Never Used  Substance Use Topics  . Alcohol use: No  . Drug use: No     Allergies   Amoxicillin and Doxycycline   Review of Systems Review of Systems 10 Systems reviewed and are negative for acute change except as noted in the HPI.  Physical Exam Updated Vital Signs BP (!) 146/94 (BP Location: Left Arm)   Pulse 85   Temp 98.1 F (36.7 C) (Oral)   Resp 15   Ht 5\' 8"  (1.727 m)   Wt 83 kg   SpO2 90%   BMI 27.83 kg/m   Physical Exam  Constitutional: He is oriented to person, place, and time.  Alert and nontoxic.  Clinically well in appearance.  No respiratory distress.  HENT:  Head: Normocephalic and atraumatic.  Eyes: EOM are normal.  Cardiovascular: Normal rate, regular rhythm, normal heart sounds and intact distal pulses.  Pulmonary/Chest: Effort normal and breath sounds normal.  Abdominal: Soft. Bowel sounds are normal. He exhibits no distension. There is no tenderness. There is no guarding.  Genitourinary:  Genitourinary Comments: Pain is normal in appearance.  Uncircumcised male.  Prepuce retracts easily and normal glands.  Scrotum nontender.  No fullness or swelling.  Rectal exam: Normal.  Small amount of formed stool in the vault.  No impaction.  Stool yellowish-brown.  Prostate firm with some nodules but nontender.  Musculoskeletal: Normal range of motion. He exhibits no edema or tenderness.  Neurological: He is alert and oriented to person,  place, and time. No cranial nerve deficit. He exhibits normal muscle tone. Coordination normal.  Skin: Skin is warm and dry.  Psychiatric: He has a normal mood and affect.     ED Treatments / Results  Labs (all labs ordered are listed, but only abnormal results are displayed) Labs Reviewed  COMPREHENSIVE METABOLIC PANEL - Abnormal; Notable for the following components:      Result Value   BUN 28 (*)    Creatinine, Ser 1.26 (*)    GFR calc non Af Amer 49 (*)    GFR calc Af Amer 57 (*)    All other components within  normal limits  CBC WITH DIFFERENTIAL/PLATELET - Abnormal; Notable for the following components:   WBC 10.6 (*)    All other components within normal limits  LIPASE, BLOOD  URINALYSIS, ROUTINE W REFLEX MICROSCOPIC  I-STAT CG4 LACTIC ACID, ED  I-STAT CG4 LACTIC ACID, ED    EKG None  Radiology No results found.  Procedures Procedures (including critical care time)  Medications Ordered in ED Medications - No data to display   Initial Impression / Assessment and Plan / ED Course  I have reviewed the triage vital signs and the nursing notes.  Pertinent labs & imaging results that were available during my care of the patient were reviewed by me and considered in my medical decision making (see chart for details).    Patient is clinically well in appearance.  Describes lower abdominal pain and indicates his inguinal region and his scrotum as well.  Physical examination is essentially nontender.  No scrotal swelling or significant tenderness of the testicles.  Patient had CT scan of the abdomen and scrotal ultrasound 8 days ago.  He does not describe his symptoms as having gotten significantly worse or changed.  They are persisting.  At this time, I do not find any acute findings on physical exam to suggest that there has been a change would necessitate repeat imaging.  I do suggest the patient follow-up with urology.  He does have prostate enlargement but no signs of  urinary retention.  Prostate is not tender to palpation to suggest prostatitis.  I do feel patient stable for discharge with subsequent outpatient evaluation.   Final Clinical Impressions(s) / ED Diagnoses   Final diagnoses:  Lower abdominal pain    ED Discharge Orders    None       Christopher Barrette, MD 06/28/18 2018

## 2018-09-14 ENCOUNTER — Inpatient Hospital Stay (HOSPITAL_COMMUNITY)
Admission: EM | Admit: 2018-09-14 | Discharge: 2018-09-17 | DRG: 291 | Disposition: A | Discharge status: Home / Self Care | Payer: Medicare Other | Attending: Family Medicine | Admitting: Family Medicine

## 2018-09-14 ENCOUNTER — Emergency Department (HOSPITAL_COMMUNITY): Payer: Medicare Other

## 2018-09-14 ENCOUNTER — Encounter (HOSPITAL_COMMUNITY): Payer: Self-pay

## 2018-09-14 DIAGNOSIS — G4700 Insomnia, unspecified: Secondary | ICD-10-CM | POA: Diagnosis present

## 2018-09-14 DIAGNOSIS — F32A Depression, unspecified: Secondary | ICD-10-CM

## 2018-09-14 DIAGNOSIS — J189 Pneumonia, unspecified organism: Secondary | ICD-10-CM | POA: Diagnosis present

## 2018-09-14 DIAGNOSIS — N179 Acute kidney failure, unspecified: Secondary | ICD-10-CM | POA: Diagnosis present

## 2018-09-14 DIAGNOSIS — N183 Chronic kidney disease, stage 3 unspecified: Secondary | ICD-10-CM

## 2018-09-14 DIAGNOSIS — J9601 Acute respiratory failure with hypoxia: Secondary | ICD-10-CM | POA: Diagnosis not present

## 2018-09-14 DIAGNOSIS — I5023 Acute on chronic systolic (congestive) heart failure: Secondary | ICD-10-CM | POA: Diagnosis present

## 2018-09-14 DIAGNOSIS — F419 Anxiety disorder, unspecified: Secondary | ICD-10-CM

## 2018-09-14 DIAGNOSIS — Z87891 Personal history of nicotine dependence: Secondary | ICD-10-CM

## 2018-09-14 DIAGNOSIS — E7800 Pure hypercholesterolemia, unspecified: Secondary | ICD-10-CM | POA: Diagnosis present

## 2018-09-14 DIAGNOSIS — E785 Hyperlipidemia, unspecified: Secondary | ICD-10-CM | POA: Diagnosis present

## 2018-09-14 DIAGNOSIS — J84112 Idiopathic pulmonary fibrosis: Secondary | ICD-10-CM | POA: Diagnosis present

## 2018-09-14 DIAGNOSIS — Z7982 Long term (current) use of aspirin: Secondary | ICD-10-CM

## 2018-09-14 DIAGNOSIS — R0902 Hypoxemia: Secondary | ICD-10-CM | POA: Diagnosis not present

## 2018-09-14 DIAGNOSIS — N3281 Overactive bladder: Secondary | ICD-10-CM | POA: Diagnosis present

## 2018-09-14 DIAGNOSIS — Z79899 Other long term (current) drug therapy: Secondary | ICD-10-CM

## 2018-09-14 DIAGNOSIS — G629 Polyneuropathy, unspecified: Secondary | ICD-10-CM | POA: Diagnosis present

## 2018-09-14 DIAGNOSIS — G8929 Other chronic pain: Secondary | ICD-10-CM | POA: Diagnosis present

## 2018-09-14 DIAGNOSIS — I130 Hypertensive heart and chronic kidney disease with heart failure and stage 1 through stage 4 chronic kidney disease, or unspecified chronic kidney disease: Secondary | ICD-10-CM | POA: Diagnosis not present

## 2018-09-14 DIAGNOSIS — I4430 Unspecified atrioventricular block: Secondary | ICD-10-CM | POA: Diagnosis present

## 2018-09-14 DIAGNOSIS — F329 Major depressive disorder, single episode, unspecified: Secondary | ICD-10-CM | POA: Diagnosis present

## 2018-09-14 DIAGNOSIS — N401 Enlarged prostate with lower urinary tract symptoms: Secondary | ICD-10-CM | POA: Diagnosis present

## 2018-09-14 DIAGNOSIS — Z66 Do not resuscitate: Secondary | ICD-10-CM | POA: Diagnosis not present

## 2018-09-14 DIAGNOSIS — Z881 Allergy status to other antibiotic agents status: Secondary | ICD-10-CM

## 2018-09-14 DIAGNOSIS — R5381 Other malaise: Secondary | ICD-10-CM

## 2018-09-14 DIAGNOSIS — K219 Gastro-esophageal reflux disease without esophagitis: Secondary | ICD-10-CM | POA: Diagnosis present

## 2018-09-14 DIAGNOSIS — Z791 Long term (current) use of non-steroidal anti-inflammatories (NSAID): Secondary | ICD-10-CM

## 2018-09-14 DIAGNOSIS — I10 Essential (primary) hypertension: Secondary | ICD-10-CM

## 2018-09-14 DIAGNOSIS — Z950 Presence of cardiac pacemaker: Secondary | ICD-10-CM

## 2018-09-14 DIAGNOSIS — Z880 Allergy status to penicillin: Secondary | ICD-10-CM

## 2018-09-14 DIAGNOSIS — F0390 Unspecified dementia without behavioral disturbance: Secondary | ICD-10-CM

## 2018-09-14 DIAGNOSIS — Z79891 Long term (current) use of opiate analgesic: Secondary | ICD-10-CM

## 2018-09-14 DIAGNOSIS — I509 Heart failure, unspecified: Secondary | ICD-10-CM

## 2018-09-14 DIAGNOSIS — R609 Edema, unspecified: Secondary | ICD-10-CM

## 2018-09-14 LAB — CBC WITH DIFFERENTIAL/PLATELET
ABS IMMATURE GRANULOCYTES: 0.06 10*3/uL (ref 0.00–0.07)
Basophils Absolute: 0.1 10*3/uL (ref 0.0–0.1)
Basophils Relative: 1 %
EOS ABS: 0.2 10*3/uL (ref 0.0–0.5)
Eosinophils Relative: 2 %
HEMATOCRIT: 51.6 % (ref 39.0–52.0)
HEMOGLOBIN: 16.6 g/dL (ref 13.0–17.0)
IMMATURE GRANULOCYTES: 1 %
LYMPHS ABS: 1.5 10*3/uL (ref 0.7–4.0)
Lymphocytes Relative: 18 %
MCH: 30.2 pg (ref 26.0–34.0)
MCHC: 32.2 g/dL (ref 30.0–36.0)
MCV: 93.8 fL (ref 80.0–100.0)
MONOS PCT: 8 %
Monocytes Absolute: 0.7 10*3/uL (ref 0.1–1.0)
NEUTROS PCT: 70 %
Neutro Abs: 6.2 10*3/uL (ref 1.7–7.7)
Platelets: 183 10*3/uL (ref 150–400)
RBC: 5.5 MIL/uL (ref 4.22–5.81)
RDW: 14.9 % (ref 11.5–15.5)
WBC: 8.7 10*3/uL (ref 4.0–10.5)
nRBC: 0 % (ref 0.0–0.2)

## 2018-09-14 LAB — COMPREHENSIVE METABOLIC PANEL
ALBUMIN: 3.5 g/dL (ref 3.5–5.0)
ALT: 20 U/L (ref 0–44)
AST: 27 U/L (ref 15–41)
Alkaline Phosphatase: 44 U/L (ref 38–126)
Anion gap: 7 (ref 5–15)
BUN: 30 mg/dL — AB (ref 8–23)
CHLORIDE: 110 mmol/L (ref 98–111)
CO2: 23 mmol/L (ref 22–32)
CREATININE: 1.17 mg/dL (ref 0.61–1.24)
Calcium: 9.2 mg/dL (ref 8.9–10.3)
GFR calc Af Amer: >60 mL/min (ref >60)
GFR calc non Af Amer: 56 mL/min — ABNORMAL LOW (ref >60)
GLUCOSE: 100 mg/dL — AB (ref 70–99)
Potassium: 4.6 mmol/L (ref 3.5–5.1)
SODIUM: 140 mmol/L (ref 135–145)
Total Bilirubin: 0.6 mg/dL (ref 0.3–1.2)
Total Protein: 6.2 g/dL — ABNORMAL LOW (ref 6.5–8.1)

## 2018-09-14 LAB — BRAIN NATRIURETIC PEPTIDE: B NATRIURETIC PEPTIDE 5: 179.1 pg/mL — AB (ref 0.0–100.0)

## 2018-09-14 LAB — I-STAT TROPONIN, ED: Troponin i, poc: 0.02 ng/mL (ref 0.00–0.08)

## 2018-09-14 LAB — PROCALCITONIN: Procalcitonin: <0.1 ng/mL

## 2018-09-14 MED ORDER — LORATADINE 10 MG PO TABS
10.0000 mg | ORAL_TABLET | Freq: Every day | ORAL | Status: DC
  Administered 2018-09-15 – 2018-09-17 (×3): 10 mg via ORAL
  Filled 2018-09-14 (×3): qty 1

## 2018-09-14 MED ORDER — DONEPEZIL HCL 10 MG PO TABS
10.0000 mg | ORAL_TABLET | Freq: Every day | ORAL | Status: DC
  Administered 2018-09-14 – 2018-09-17 (×4): 10 mg via ORAL
  Filled 2018-09-14 (×4): qty 1

## 2018-09-14 MED ORDER — MELATONIN 3 MG PO TABS
3.0000 mg | ORAL_TABLET | Freq: Every day | ORAL | Status: DC
  Administered 2018-09-14 – 2018-09-17 (×4): 3 mg via ORAL
  Filled 2018-09-14 (×4): qty 1

## 2018-09-14 MED ORDER — GABAPENTIN 300 MG PO CAPS
300.0000 mg | ORAL_CAPSULE | Freq: Three times a day (TID) | ORAL | Status: DC
  Administered 2018-09-14 – 2018-09-17 (×8): 300 mg via ORAL
  Filled 2018-09-14 (×8): qty 1

## 2018-09-14 MED ORDER — ASPIRIN EC 81 MG PO TBEC
81.0000 mg | DELAYED_RELEASE_TABLET | Freq: Every day | ORAL | Status: DC
  Administered 2018-09-15 – 2018-09-17 (×3): 81 mg via ORAL
  Filled 2018-09-14 (×3): qty 1

## 2018-09-14 MED ORDER — MONTELUKAST SODIUM 10 MG PO TABS
10.0000 mg | ORAL_TABLET | Freq: Every day | ORAL | Status: DC
  Administered 2018-09-14 – 2018-09-17 (×4): 10 mg via ORAL
  Filled 2018-09-14 (×4): qty 1

## 2018-09-14 MED ORDER — MIRTAZAPINE 15 MG PO TABS
30.0000 mg | ORAL_TABLET | Freq: Every day | ORAL | Status: DC
  Administered 2018-09-14 – 2018-09-17 (×4): 30 mg via ORAL
  Filled 2018-09-14 (×3): qty 2
  Filled 2018-09-14: qty 1

## 2018-09-14 MED ORDER — IPRATROPIUM BROMIDE 0.03 % NA SOLN
2.0000 | Freq: Four times a day (QID) | NASAL | Status: DC | PRN
  Filled 2018-09-14: qty 30

## 2018-09-14 MED ORDER — LORAZEPAM 1 MG PO TABS
1.0000 mg | ORAL_TABLET | Freq: Three times a day (TID) | ORAL | Status: DC
  Administered 2018-09-14 – 2018-09-17 (×9): 1 mg via ORAL
  Filled 2018-09-14 (×9): qty 1

## 2018-09-14 MED ORDER — FUROSEMIDE 10 MG/ML IJ SOLN
60.0000 mg | Freq: Once | INTRAMUSCULAR | Status: AC
  Administered 2018-09-14: 60 mg via INTRAVENOUS
  Filled 2018-09-14: qty 6

## 2018-09-14 MED ORDER — AZELASTINE HCL 0.1 % NA SOLN
2.0000 | Freq: Two times a day (BID) | NASAL | Status: DC
  Administered 2018-09-14 – 2018-09-17 (×6): 2 via NASAL
  Filled 2018-09-14: qty 30

## 2018-09-14 MED ORDER — MIRABEGRON ER 25 MG PO TB24
25.0000 mg | ORAL_TABLET | Freq: Every day | ORAL | Status: DC
  Administered 2018-09-15 – 2018-09-17 (×3): 25 mg via ORAL
  Filled 2018-09-14 (×4): qty 1

## 2018-09-14 MED ORDER — PAROXETINE HCL 20 MG PO TABS
20.0000 mg | ORAL_TABLET | Freq: Every day | ORAL | Status: DC
  Administered 2018-09-14 – 2018-09-17 (×4): 20 mg via ORAL
  Filled 2018-09-14 (×4): qty 1

## 2018-09-14 MED ORDER — FUROSEMIDE 10 MG/ML IJ SOLN
40.0000 mg | Freq: Two times a day (BID) | INTRAMUSCULAR | Status: DC
  Administered 2018-09-15: 40 mg via INTRAVENOUS
  Filled 2018-09-14: qty 4

## 2018-09-14 MED ORDER — EUCERIN EX CREA: 1.0000 "application " | TOPICAL_CREAM | CUTANEOUS | Status: DC

## 2018-09-14 MED ORDER — FAMOTIDINE 20 MG PO TABS
10.0000 mg | ORAL_TABLET | Freq: Every day | ORAL | Status: DC
  Administered 2018-09-14 – 2018-09-17 (×4): 10 mg via ORAL
  Filled 2018-09-14 (×4): qty 1

## 2018-09-14 MED ORDER — ENOXAPARIN SODIUM 40 MG/0.4ML ~~LOC~~ SOLN
40.0000 mg | SUBCUTANEOUS | Status: DC
  Administered 2018-09-14 – 2018-09-17 (×4): 40 mg via SUBCUTANEOUS
  Filled 2018-09-14 (×4): qty 0.4

## 2018-09-14 MED ORDER — ACETAMINOPHEN 325 MG PO TABS: 650.0000 mg | ORAL_TABLET | Freq: Four times a day (QID) | ORAL | Status: DC | PRN

## 2018-09-14 MED ORDER — IPRATROPIUM-ALBUTEROL 0.5-2.5 (3) MG/3ML IN SOLN
3.0000 mL | Freq: Once | RESPIRATORY_TRACT | Status: AC
  Administered 2018-09-14: 3 mL via RESPIRATORY_TRACT
  Filled 2018-09-14: qty 3

## 2018-09-14 MED ORDER — TAB-A-VITE/IRON PO TABS
1.0000 | ORAL_TABLET | Freq: Every day | ORAL | Status: DC
  Administered 2018-09-15 – 2018-09-17 (×3): 1 via ORAL
  Filled 2018-09-14 (×4): qty 1

## 2018-09-14 MED ORDER — HYDROCODONE-ACETAMINOPHEN 5-325 MG PO TABS: 1.0000 | ORAL_TABLET | Freq: Four times a day (QID) | ORAL | Status: DC | PRN

## 2018-09-14 MED ORDER — CRANBERRY 450 MG PO TABS: 450.0000 mg | ORAL_TABLET | Freq: Every day | ORAL | Status: DC

## 2018-09-14 MED ORDER — HYPROMELLOSE (GONIOSCOPIC) 2.5 % OP SOLN
2.0000 [drp] | Freq: Every day | OPHTHALMIC | Status: DC
  Filled 2018-09-14: qty 15

## 2018-09-14 MED ORDER — POLYETHYLENE GLYCOL 3350 17 G PO PACK
17.0000 g | PACK | Freq: Every day | ORAL | Status: DC
  Administered 2018-09-16 – 2018-09-17 (×2): 17 g via ORAL
  Filled 2018-09-14 (×2): qty 1

## 2018-09-14 MED ORDER — TRAMADOL HCL 50 MG PO TABS
50.0000 mg | ORAL_TABLET | Freq: Three times a day (TID) | ORAL | Status: DC
  Administered 2018-09-15 – 2018-09-17 (×8): 50 mg via ORAL
  Filled 2018-09-14 (×8): qty 1

## 2018-09-14 MED ORDER — TAB-A-VITE PO TABS: 1.0000 | ORAL_TABLET | Freq: Every day | ORAL | Status: DC

## 2018-09-14 MED ORDER — SODIUM CHLORIDE 0.9 % IV SOLN
500.0000 mg | Freq: Once | INTRAVENOUS | Status: AC
  Administered 2018-09-14: 500 mg via INTRAVENOUS
  Filled 2018-09-14: qty 500

## 2018-09-14 MED ORDER — HYDROCERIN EX CREA
TOPICAL_CREAM | Freq: Two times a day (BID) | CUTANEOUS | Status: DC | PRN
  Filled 2018-09-14: qty 113

## 2018-09-14 MED ORDER — MAGNESIUM HYDROXIDE 400 MG/5ML PO SUSP: 30.0000 mL | Freq: Every day | ORAL | Status: DC | PRN

## 2018-09-14 MED ORDER — SODIUM CHLORIDE 0.9 % IV SOLN
1.0000 g | Freq: Once | INTRAVENOUS | Status: AC
  Administered 2018-09-14: 1 g via INTRAVENOUS
  Filled 2018-09-14: qty 10

## 2018-09-14 MED ORDER — FLUTICASONE PROPIONATE 50 MCG/ACT NA SUSP
2.0000 | Freq: Every day | NASAL | Status: DC
  Administered 2018-09-15 – 2018-09-17 (×2): 2 via NASAL
  Filled 2018-09-14: qty 16

## 2018-09-14 MED ORDER — QUETIAPINE FUMARATE 25 MG PO TABS
25.0000 mg | ORAL_TABLET | Freq: Two times a day (BID) | ORAL | Status: DC
  Administered 2018-09-14 – 2018-09-17 (×6): 25 mg via ORAL
  Filled 2018-09-14 (×7): qty 1

## 2018-09-14 NOTE — ED Provider Notes (Signed)
[facility] EMERGENCY DEPARTMENT Provider Note   CSN: [id number] Arrival date & time: 09/14/18  1529     History   Chief Complaint No chief complaint on file.   HPI Tillman Inserra Sr. is a 85 y.o. male.  HPI Nicandro Outcalt Sr. is a 85 y.o. male with history of CHF, prior hypoxia, pacemaker due to AV block, presents to emergency department with complaint of shortness of breath.  Patient states he has been short of breath especially on exertion over the last 3 weeks.  He states symptoms have gotten worse recently.  EMS was called, found patient to be hypoxic with oxygen saturation in the low 80s.  He was placed on 3 L and patient states he felt better.  There was apparently some dynamic EKG changes by EMS.  EKG changes improved after they started him on oxygen.  Patient reports pressure in his chest.  He denies any cough or congestion.  He states symptoms get worse when he is walking and improved when at rest.  He denies any other complaints.  No swelling in extremities.  Past Medical History:  Diagnosis Date  . Acid reflux   . Anxiety   . AV block   . BPH (benign prostatic hyperplasia)   . CHF (congestive heart failure) (HCC)   . Depression   . High cholesterol   . Hypertension   . Hypoxia   . Pacemaker     Patient Active Problem List   Diagnosis Date Noted  . Hypoxia 12/21/2017  . Chronic rhinitis 09/10/2017    Past Surgical History:  Procedure Laterality Date  . CHOLECYSTECTOMY          Home Medications    Prior to Admission medications   Medication Sig Start Date End Date Taking? Authorizing Provider  acetaminophen (TYLENOL) 325 MG tablet Take 2 tablets (650 mg total) by mouth every 6 (six) hours as needed. 03/01/17   Barrett Henle, PA-C  aspirin EC 81 MG tablet Take 81 mg by mouth daily.    [provider]  azelastine (ASTELIN) 0.1 % nasal spray Place 2 sprays into both nostrils 2 (two) times daily. Use in each nostril as  directed 11/12/17   Ambs, Norvel Richards, FNP  cetirizine (ZYRTEC) 10 MG tablet Take 1 tablet (10 mg total) by mouth daily. 09/10/17   Alfonse Spruce, MD  Cranberry 450 MG TABS Take 450 mg by mouth daily.    [provider]  donepezil (ARICEPT) 10 MG tablet Take 10 mg by mouth at bedtime.    [provider]  fluticasone (FLONASE) 50 MCG/ACT nasal spray Place 2 sprays into both nostrils daily. 09/10/17   Alfonse Spruce, MD  gabapentin (NEURONTIN) 300 MG capsule Take 300 mg by mouth 3 (three) times daily.    [provider]  HYDROcodone-acetaminophen (NORCO/VICODIN) 5-325 MG tablet Take 1 tablet by mouth every 6 (six) hours as needed for moderate pain.    [provider]  hydroxypropyl methylcellulose / hypromellose (ISOPTO TEARS / GONIOVISC) 2.5 % ophthalmic solution Place 2 drops into both eyes daily.    [provider]  ipratropium (ATROVENT) 0.03 % nasal spray Place 2 sprays into both nostrils every 6 (six) hours as needed for rhinitis. 09/10/17   Alfonse Spruce, MD  LORazepam (ATIVAN) 1 MG tablet Take 1 mg by mouth 3 (three) times daily.     [provider]  Melatonin 3 MG TABS Take 3 mg by mouth at bedtime.  [provider]  meloxicam (MOBIC) 15 MG tablet Take 15 mg by mouth daily.    [provider]  mirtazapine (REMERON) 15 MG tablet Take 30 mg by mouth at bedtime.     [provider]  montelukast (SINGULAIR) 10 MG tablet Take 1 tablet (10 mg total) by mouth at bedtime. 09/10/17   Alfonse SpruceGallagher, Joel Louis, MD  Multiple Vitamins-Minerals (MULTIVITAMIN WITH MINERALS) tablet Take 1 tablet by mouth daily.    [provider]  MYRBETRIQ 25 MG TB24 tablet Take 25 mg by mouth daily. 12/18/17   [provider]  PARoxetine (PAXIL) 10 MG tablet Take 20 mg by mouth daily.     [provider]  polyethylene glycol (MIRALAX / GLYCOLAX) packet Take 17 g by mouth daily.     [provider]    QUEtiapine (SEROQUEL) 25 MG tablet Take 25 mg by mouth 2 (two) times daily.     [provider]  ranitidine (ZANTAC) 150 MG tablet Take 150 mg by mouth at bedtime.     [provider]  Skin Protectants, Misc. (EUCERIN) cream Apply 1 application topically as needed for dry skin.     [provider]  traMADol (ULTRAM) 50 MG tablet Take 50 mg by mouth 3 (three) times daily.     [provider]    Family History No family history on file.  Social History Social History   Tobacco Use  . Smoking status: Former Games developermoker  . Smokeless tobacco: Never Used  Substance Use Topics  . Alcohol use: No  . Drug use: No     Allergies   Amoxicillin and Doxycycline   Review of Systems Review of Systems  Constitutional: Negative for chills and fever.  Respiratory: Positive for chest tightness and shortness of breath. Negative for cough.   Cardiovascular: Positive for chest pain. Negative for palpitations and leg swelling.  Gastrointestinal: Negative for abdominal distention, abdominal pain, diarrhea, nausea and vomiting.  Genitourinary: Negative for dysuria, frequency, hematuria and urgency.  Musculoskeletal: Negative for arthralgias, myalgias, neck pain and neck stiffness.  Skin: Negative for rash.  Allergic/Immunologic: Negative for immunocompromised state.  Neurological: Negative for dizziness, weakness, light-headedness, numbness and headaches.     Physical Exam Updated Vital Signs BP 125/79   Pulse 74   Temp 98.5 F (36.9 C) (Oral)   Resp 20   Ht 5\' 8"  (1.727 m)   Wt 83 kg   SpO2 95%   BMI 27.82 kg/m   Physical Exam Vitals signs and nursing note reviewed.  Constitutional:      General: He is not in acute distress.    Appearance: He is well-developed.  HENT:     Head: Normocephalic and atraumatic.  Eyes:     Conjunctiva/sclera: Conjunctivae normal.  Neck:     Musculoskeletal: Neck supple.  Cardiovascular:     Rate and Rhythm: Normal  rate and regular rhythm.     Heart sounds: Normal heart sounds.  Pulmonary:     Effort: Pulmonary effort is normal. No respiratory distress.     Breath sounds: Rales present. No wheezing.     Comments: Rales at bases bilaterally Abdominal:     General: Bowel sounds are normal. There is no distension.     Palpations: Abdomen is soft.     Tenderness: There is no abdominal tenderness. There is no rebound.  Skin:    General: Skin is warm and dry.  Neurological:     Mental Status: He is alert.  ED Treatments / Results  Labs (all labs ordered are listed, but only abnormal results are displayed) Labs Reviewed  BRAIN NATRIURETIC PEPTIDE - Abnormal; Notable for the following components:      Result Value   B Natriuretic Peptide 179.1 (*)    All other components within normal limits  COMPREHENSIVE METABOLIC PANEL - Abnormal; Notable for the following components:   Glucose, Bld 100 (*)    BUN 30 (*)    Total Protein 6.2 (*)    GFR calc non Af Amer 56 (*)    All other components within normal limits  CBC WITH DIFFERENTIAL/PLATELET  I-STAT TROPONIN, ED    EKG EKG Interpretation  Date/Time:  Tuesday September 14 2018 15:44:59 EST Ventricular Rate:  76 PR Interval:    QRS Duration: 123 QT Interval:  417 QTC Calculation: 469 R Axis:   -130 Text Interpretation:  Sinus rhythm Nonspecific intraventricular conduction delay Inferolateral infarct, old since previous tracing, now sinus rhythm from paced rhythm Confirmed by Frederick PeersLittle, Rachel [phone]([id number]) on 09/14/2018 3:47:52 PM   Radiology Dg Chest 2 View  Result Date: 09/14/2018 CLINICAL DATA:  Shortness of breath EXAM: CHEST - 2 VIEW COMPARISON:  None. FINDINGS: Left-sided pacing device. Mild cardiomegaly with vascular congestion. Probable small pleural effusions. Diffuse interstitial and ground-glass opacity, possible edema. More patchy focal airspace disease in the left mid lung and left base. No pneumothorax. IMPRESSION: 1. Cardiomegaly with  vascular congestion and mild diffuse interstitial and ground-glass opacity, possible edema. Probable trace pleural effusion 2. Patchy foci of airspace disease in the left mid lung and lung base, may reflect atelectasis or superimposed infection. Electronically Signed   By: Jasmine PangKim  Fujinaga M.D.   On: 09/14/2018 16:56    Procedures Procedures (including critical care time)  Medications Ordered in ED Medications  azithromycin (ZITHROMAX) 500 mg in sodium chloride 0.9 % 250 mL IVPB (500 mg Intravenous New Bag/Given 09/14/18 1815)  cefTRIAXone (ROCEPHIN) 1 g in sodium chloride 0.9 % 100 mL IVPB (0 g Intravenous Stopped 09/14/18 1813)  furosemide (LASIX) injection 60 mg (60 mg Intravenous Given 09/14/18 1713)  ipratropium-albuterol (DUONEB) 0.5-2.5 (3) MG/3ML nebulizer solution 3 mL (3 mLs Nebulization Given 09/14/18 1843)     Initial Impression / Assessment and Plan / ED Course  I have reviewed the triage vital signs and the nursing notes.  Pertinent labs & imaging results that were available during my care of the patient were reviewed by me and considered in my medical decision making (see chart for details).     Patient seen and examined.  Patient with shortness of breath for several weeks, worse in the last few days.  Symptoms are exertional.  Will get labs including troponin and BNP.  Will get a chest x-ray.  Vital signs are normal now on 3 L of oxygen and.  Was hypoxic prior to that.  CXR concerning for possible interstitial edema and possible pneumonia.  Will cover for possible infection although unlikely since white blood cell count is normal patient is not coughing.  Electrolytes unremarkable.  I will admit patient for further evaluation and treatment of his hypoxia.  We will try Lasix and DuoNeb as well.   6:54 PM Patient states he is not feeling much better.  I spoke with hospitalist, they will admit patient.  Vitals:   09/14/18 1630 09/14/18 1800 09/14/18 1815 09/14/18 1843  BP: 137/85  (!) 145/90 (!) 148/94 (!) 144/88  Pulse: 73 70 69   Resp: (!) 22 (!) 28 (!) 23 Marland Kitchen(!)  185  Temp:      TempSrc:      SpO2: 95% 93% 91% 94%  Weight:      Height:         Final Clinical Impressions(s) / ED Diagnoses   Final diagnoses:  Hypoxia  Community acquired pneumonia, unspecified laterality  Edema, unspecified type    ED Discharge Orders    None       Jaynie Crumble, PA-C 09/14/18 1856    Little, Ambrose Finland, MD 09/14/18 1926

## 2018-09-14 NOTE — H&P (Signed)
History and Physical    Christopher Gilberts Sr. DGL:[id number] DOB: August 09, 1929 DOA: 09/14/2018  PCP: Forrest Moron, MD Patient coming from: Nursing home  Chief Complaint: Dyspnea on exertion  HPI: Christopher Marsman Sr. is a 85 y.o. male with medical history significant of AV block status post pacemaker, CHF, hypertension, hyperlipidemia, BPH presenting to the hospital via EMS for evaluation of dyspnea on exertion.  His oxygen saturation was 86% on room air and he was placed on 3 L supplemental oxygen.  At baseline, patient is alert to person and place only.  Patient reports having dyspnea on exertion and fatigue for the past 2 to 3 weeks.  Denies having any lower extremity edema or orthopnea.  Denies having any chest pain, fevers, chills, or cough.  No additional history could be obtained from the patient.  Review of Systems: As per HPI otherwise 10 point review of systems negative.  Past Medical History:  Diagnosis Date  . Acid reflux   . Anxiety   . AV block   . BPH (benign prostatic hyperplasia)   . CHF (congestive heart failure) (HCC)   . Depression   . High cholesterol   . Hypertension   . Hypoxia   . Pacemaker     Past Surgical History:  Procedure Laterality Date  . CHOLECYSTECTOMY       reports that he has quit smoking. He has never used smokeless tobacco. He reports that he does not drink alcohol or use drugs.  Allergies  Allergen Reactions  . Amoxicillin Hives  . Doxycycline Rash    No family history on file.  Prior to Admission medications   Medication Sig Start Date End Date Taking? Authorizing Provider  acetaminophen (TYLENOL) 325 MG tablet Take 2 tablets (650 mg total) by mouth every 6 (six) hours as needed. 03/01/17   Barrett Henle, PA-C  aspirin EC 81 MG tablet Take 81 mg by mouth daily.    [provider]  azelastine (ASTELIN) 0.1 % nasal spray Place 2 sprays into both nostrils 2 (two) times daily. Use in each nostril as directed 11/12/17   Ambs,  Norvel Richards, FNP  cetirizine (ZYRTEC) 10 MG tablet Take 1 tablet (10 mg total) by mouth daily. 09/10/17   Alfonse Spruce, MD  Cranberry 450 MG TABS Take 450 mg by mouth daily.    [provider]  donepezil (ARICEPT) 10 MG tablet Take 10 mg by mouth at bedtime.    [provider]  fluticasone (FLONASE) 50 MCG/ACT nasal spray Place 2 sprays into both nostrils daily. 09/10/17   Alfonse Spruce, MD  gabapentin (NEURONTIN) 300 MG capsule Take 300 mg by mouth 3 (three) times daily.    [provider]  HYDROcodone-acetaminophen (NORCO/VICODIN) 5-325 MG tablet Take 1 tablet by mouth every 6 (six) hours as needed for moderate pain.    [provider]  hydroxypropyl methylcellulose / hypromellose (ISOPTO TEARS / GONIOVISC) 2.5 % ophthalmic solution Place 2 drops into both eyes daily.    [provider]  ipratropium (ATROVENT) 0.03 % nasal spray Place 2 sprays into both nostrils every 6 (six) hours as needed for rhinitis. 09/10/17   Alfonse Spruce, MD  LORazepam (ATIVAN) 1 MG tablet Take 1 mg by mouth 3 (three) times daily.     [provider]  Melatonin 3 MG TABS Take 3 mg by mouth at bedtime.     [provider]  meloxicam (MOBIC) 15 MG tablet Take 15 mg by mouth daily.  [provider]  mirtazapine (REMERON) 15 MG tablet Take 30 mg by mouth at bedtime.     [provider]  montelukast (SINGULAIR) 10 MG tablet Take 1 tablet (10 mg total) by mouth at bedtime. 09/10/17   Alfonse SpruceGallagher, Joel Louis, MD  Multiple Vitamins-Minerals (MULTIVITAMIN WITH MINERALS) tablet Take 1 tablet by mouth daily.    [provider]  MYRBETRIQ 25 MG TB24 tablet Take 25 mg by mouth daily. 12/18/17   [provider]  PARoxetine (PAXIL) 10 MG tablet Take 20 mg by mouth daily.     [provider]  polyethylene glycol (MIRALAX / GLYCOLAX) packet Take 17 g by mouth daily.     [provider]  QUEtiapine (SEROQUEL) 25  MG tablet Take 25 mg by mouth 2 (two) times daily.     [provider]  ranitidine (ZANTAC) 150 MG tablet Take 150 mg by mouth at bedtime.     [provider]  Skin Protectants, Misc. (EUCERIN) cream Apply 1 application topically as needed for dry skin.     [provider]  traMADol (ULTRAM) 50 MG tablet Take 50 mg by mouth 3 (three) times daily.     [provider]    Physical Exam: Vitals:   09/14/18 1900 09/14/18 1915 09/14/18 1945 09/14/18 2000  BP: 138/89  137/85   Pulse: 69  70   Resp:  (!) 22 (!) 23 (!) 22  Temp:      TempSrc:      SpO2:  94% 94%   Weight:      Height:        Physical Exam  Constitutional: No distress.  Resting comfortably in a hospital stretcher  HENT:  Head: Normocephalic.  Mouth/Throat: Oropharynx is clear and moist.  Eyes: Right eye exhibits no discharge. Left eye exhibits no discharge.  Neck: Neck supple.  Cardiovascular: Normal rate, regular rhythm and intact distal pulses.  Pulmonary/Chest: Effort normal. He has no wheezes. He has rales.  Speaking clearly in full sentences No accessory muscle use Bibasilar rales appreciated  Abdominal: Soft. Bowel sounds are normal. He exhibits no distension. There is no abdominal tenderness. There is no guarding.  Musculoskeletal:        General: No edema.  Neurological:  Awake and alert Oriented to person and place (baseline) Answering questions appropriately  Skin: Skin is warm and dry. He is not diaphoretic.     Labs on Admission: I have personally reviewed following labs and imaging studies  CBC: Recent Labs  Lab 09/14/18 1547  WBC 8.7  NEUTROABS 6.2  HGB 16.6  HCT 51.6  MCV 93.8  PLT 183   Basic Metabolic Panel: Recent Labs  Lab 09/14/18 1721  NA 140  K 4.6  CL 110  CO2 23  GLUCOSE 100*  BUN 30*  CREATININE 1.17  CALCIUM 9.2   GFR: Estimated Creatinine Clearance: 46.7 mL/min (by C-G formula based on SCr of 1.17 mg/dL). Liver Function  Tests: Recent Labs  Lab 09/14/18 1721  AST 27  ALT 20  ALKPHOS 44  BILITOT 0.6  PROT 6.2*  ALBUMIN 3.5   No results for input(s): LIPASE, AMYLASE in the last 168 hours. No results for input(s): AMMONIA in the last 168 hours. Coagulation Profile: No results for input(s): INR, PROTIME in the last 168 hours. Cardiac Enzymes: No results for input(s): CKTOTAL, CKMB, CKMBINDEX, TROPONINI in the last 168 hours. BNP (last 3 results) No results for input(s): PROBNP in the last 8760 hours. HbA1C:  No results for input(s): HGBA1C in the last 72 hours. CBG: No results for input(s): GLUCAP in the last 168 hours. Lipid Profile: No results for input(s): CHOL, HDL, LDLCALC, TRIG, CHOLHDL, LDLDIRECT in the last 72 hours. Thyroid Function Tests: No results for input(s): TSH, T4TOTAL, FREET4, T3FREE, THYROIDAB in the last 72 hours. Anemia Panel: No results for input(s): VITAMINB12, FOLATE, FERRITIN, TIBC, IRON, RETICCTPCT in the last 72 hours. Urine analysis:    Component Value Date/Time   COLORURINE YELLOW 06/28/2018 1815   APPEARANCEUR CLEAR 06/28/2018 1815   LABSPEC 1.012 06/28/2018 1815   PHURINE 5.0 06/28/2018 1815   GLUCOSEU NEGATIVE 06/28/2018 1815   HGBUR NEGATIVE 06/28/2018 1815   BILIRUBINUR NEGATIVE 06/28/2018 1815   KETONESUR NEGATIVE 06/28/2018 1815   PROTEINUR NEGATIVE 06/28/2018 1815   NITRITE NEGATIVE 06/28/2018 1815   LEUKOCYTESUR NEGATIVE 06/28/2018 1815    Radiological Exams on Admission: Dg Chest 2 View  Result Date: 09/14/2018 CLINICAL DATA:  Shortness of breath EXAM: CHEST - 2 VIEW COMPARISON:  None. FINDINGS: Left-sided pacing device. Mild cardiomegaly with vascular congestion. Probable small pleural effusions. Diffuse interstitial and ground-glass opacity, possible edema. More patchy focal airspace disease in the left mid lung and left base. No pneumothorax. IMPRESSION: 1. Cardiomegaly with vascular congestion and mild diffuse interstitial and ground-glass opacity,  possible edema. Probable trace pleural effusion 2. Patchy foci of airspace disease in the left mid lung and lung base, may reflect atelectasis or superimposed infection. Electronically Signed   By: Jasmine PangKim  Fujinaga M.D.   On: 09/14/2018 16:56    EKG: Independently reviewed.  Sinus rhythm.  Previous tracing was paced, now sinus rhythm.  Assessment/Plan Principal Problem:   Acute respiratory failure with hypoxia (HCC) Active Problems:   CHF exacerbation (HCC)   CKD (chronic kidney disease) stage 3, GFR 30-59 ml/min (HCC)   Dementia (HCC)   Chronic pain   Neuropathy   HTN (hypertension)   Overactive bladder   Depression   Anxiety   Physical deconditioning   Acute hypoxic respiratory failure secondary to acute exacerbation of chronic systolic CHF and possible pneumonia -Patient is presenting with a 2 to 3-week history of fatigue and dyspnea on exertion.   -BNP 179, above baseline.  Chest x-ray showing cardiomegaly with vascular congestion and mild diffuse interstitial and groundglass opacity, possible edema.  Probable trace pleural effusion.  Patient received IV Lasix 60 mg in the ED. Currently not on a diuretic at home.  [facility] EF 25 to 30% and severe global LV hypokinesis.  Renal function at baseline.  Continue IV Lasix 40 mg twice daily starting in the morning.  Continue to monitor renal function with IV diuresis.  Monitor intake and output.  Daily weights.  Low-sodium diet with fluid restriction.  Repeat echocardiogram. -Chest x-ray showing patchy foci of airspace disease in the left midlung and lung base, reflecting atelectasis vs superimposed infection.  No leukocytosis and patient is afebrile.  Patient received azithromycin and ceftriaxone in the ED.  Check procalcitonin level.  If elevated, continue antibiotics. -I-STAT troponin negative.  EKG not suggestive of ACS.  Patient is not complaining of chest pain.  CKD 3 -Creatinine 1.1, at baseline. -Continue  to monitor renal function with IV diuresis  Dementia -Continue donepezil  Chronic pain -Continue home Norco 1 tablet every 6 hours as needed -Continue tramadol  Neuropathy -Continue gabapentin  Hypertension -Continue IV diuresis  Overactive bladder -Continue Myrbetriq  Depression, insomnia, mood disorder -Continue mirtazapine, Paxil, quetiapine  Physical deconditioning -PT evaluation  Anxiety -Continue home Ativan  DVT prophylaxis: Lovenox Code Status: Patient wishes to be DNR. Family Communication: No family available Disposition Plan: Anticipate discharge in 1 to 2 days Consults called: None Admission status: Observation, telemetry   John Giovanni MD [facility] Pager [phone]  If 7PM-7AM, please contact night-coverage [email or web address] Password [facility]  09/14/2018, 9:04 PM

## 2018-09-14 NOTE — ED Triage Notes (Signed)
Pt from [facility] via EMS; c/o  SOB x 3 weeks w/ exertion; no cp, lungs clear w/ ems; RA sat 86%, RR 36; baseline alert to person, place only  3L 95%, relieved sob HR 90 RR 36 130/80

## 2018-09-14 NOTE — ED Notes (Signed)
Condom cath placed on pt 

## 2018-09-15 ENCOUNTER — Inpatient Hospital Stay (HOSPITAL_COMMUNITY): Payer: Medicare Other

## 2018-09-15 DIAGNOSIS — G629 Polyneuropathy, unspecified: Secondary | ICD-10-CM | POA: Diagnosis present

## 2018-09-15 DIAGNOSIS — F419 Anxiety disorder, unspecified: Secondary | ICD-10-CM

## 2018-09-15 DIAGNOSIS — K219 Gastro-esophageal reflux disease without esophagitis: Secondary | ICD-10-CM | POA: Diagnosis present

## 2018-09-15 DIAGNOSIS — Z880 Allergy status to penicillin: Secondary | ICD-10-CM | POA: Diagnosis not present

## 2018-09-15 DIAGNOSIS — G8929 Other chronic pain: Secondary | ICD-10-CM | POA: Diagnosis not present

## 2018-09-15 DIAGNOSIS — N183 Chronic kidney disease, stage 3 (moderate): Secondary | ICD-10-CM

## 2018-09-15 DIAGNOSIS — N3281 Overactive bladder: Secondary | ICD-10-CM | POA: Diagnosis present

## 2018-09-15 DIAGNOSIS — I10 Essential (primary) hypertension: Secondary | ICD-10-CM

## 2018-09-15 DIAGNOSIS — Z66 Do not resuscitate: Secondary | ICD-10-CM | POA: Diagnosis not present

## 2018-09-15 DIAGNOSIS — Z881 Allergy status to other antibiotic agents status: Secondary | ICD-10-CM | POA: Diagnosis not present

## 2018-09-15 DIAGNOSIS — Z79899 Other long term (current) drug therapy: Secondary | ICD-10-CM | POA: Diagnosis not present

## 2018-09-15 DIAGNOSIS — F0390 Unspecified dementia without behavioral disturbance: Secondary | ICD-10-CM

## 2018-09-15 DIAGNOSIS — R5381 Other malaise: Secondary | ICD-10-CM

## 2018-09-15 DIAGNOSIS — I4430 Unspecified atrioventricular block: Secondary | ICD-10-CM | POA: Diagnosis present

## 2018-09-15 DIAGNOSIS — R0609 Other forms of dyspnea: Secondary | ICD-10-CM | POA: Diagnosis not present

## 2018-09-15 DIAGNOSIS — J9601 Acute respiratory failure with hypoxia: Secondary | ICD-10-CM | POA: Diagnosis present

## 2018-09-15 DIAGNOSIS — F329 Major depressive disorder, single episode, unspecified: Secondary | ICD-10-CM | POA: Diagnosis present

## 2018-09-15 DIAGNOSIS — J189 Pneumonia, unspecified organism: Secondary | ICD-10-CM | POA: Diagnosis present

## 2018-09-15 DIAGNOSIS — Z7982 Long term (current) use of aspirin: Secondary | ICD-10-CM | POA: Diagnosis not present

## 2018-09-15 DIAGNOSIS — Z87891 Personal history of nicotine dependence: Secondary | ICD-10-CM | POA: Diagnosis not present

## 2018-09-15 DIAGNOSIS — Z791 Long term (current) use of non-steroidal anti-inflammatories (NSAID): Secondary | ICD-10-CM | POA: Diagnosis not present

## 2018-09-15 DIAGNOSIS — G4700 Insomnia, unspecified: Secondary | ICD-10-CM | POA: Diagnosis present

## 2018-09-15 DIAGNOSIS — N401 Enlarged prostate with lower urinary tract symptoms: Secondary | ICD-10-CM | POA: Diagnosis present

## 2018-09-15 DIAGNOSIS — I130 Hypertensive heart and chronic kidney disease with heart failure and stage 1 through stage 4 chronic kidney disease, or unspecified chronic kidney disease: Secondary | ICD-10-CM | POA: Diagnosis present

## 2018-09-15 DIAGNOSIS — N179 Acute kidney failure, unspecified: Secondary | ICD-10-CM | POA: Diagnosis present

## 2018-09-15 DIAGNOSIS — I5043 Acute on chronic combined systolic (congestive) and diastolic (congestive) heart failure: Secondary | ICD-10-CM | POA: Diagnosis not present

## 2018-09-15 DIAGNOSIS — Z950 Presence of cardiac pacemaker: Secondary | ICD-10-CM | POA: Diagnosis not present

## 2018-09-15 DIAGNOSIS — R0902 Hypoxemia: Secondary | ICD-10-CM | POA: Diagnosis present

## 2018-09-15 DIAGNOSIS — I5023 Acute on chronic systolic (congestive) heart failure: Secondary | ICD-10-CM

## 2018-09-15 DIAGNOSIS — E785 Hyperlipidemia, unspecified: Secondary | ICD-10-CM | POA: Diagnosis present

## 2018-09-15 LAB — BASIC METABOLIC PANEL
Anion gap: 11 (ref 5–15)
BUN: 28 mg/dL — ABNORMAL HIGH (ref 8–23)
CALCIUM: 9.1 mg/dL (ref 8.9–10.3)
CO2: 23 mmol/L (ref 22–32)
Chloride: 104 mmol/L (ref 98–111)
Creatinine, Ser: 1.19 mg/dL (ref 0.61–1.24)
GFR calc Af Amer: >60 mL/min (ref >60)
GFR calc non Af Amer: 55 mL/min — ABNORMAL LOW (ref >60)
Glucose, Bld: 124 mg/dL — ABNORMAL HIGH (ref 70–99)
Potassium: 4.2 mmol/L (ref 3.5–5.1)
Sodium: 138 mmol/L (ref 135–145)

## 2018-09-15 LAB — ECHOCARDIOGRAM COMPLETE
HEIGHTINCHES: 68 in
Weight: 2854.4 oz

## 2018-09-15 MED ORDER — POLYVINYL ALCOHOL 1.4 % OP SOLN
2.0000 [drp] | Freq: Every day | OPHTHALMIC | Status: DC
  Administered 2018-09-15 – 2018-09-17 (×2): 2 [drp] via OPHTHALMIC
  Filled 2018-09-15: qty 15

## 2018-09-15 MED ORDER — GUAIFENESIN ER 600 MG PO TB12
600.0000 mg | ORAL_TABLET | Freq: Two times a day (BID) | ORAL | Status: DC
  Administered 2018-09-15 – 2018-09-17 (×4): 600 mg via ORAL
  Filled 2018-09-15 (×5): qty 1

## 2018-09-15 MED ORDER — FUROSEMIDE 10 MG/ML IJ SOLN
20.0000 mg | Freq: Two times a day (BID) | INTRAMUSCULAR | Status: DC
  Administered 2018-09-15: 20 mg via INTRAVENOUS
  Filled 2018-09-15: qty 2

## 2018-09-15 NOTE — Evaluation (Signed)
Physical Therapy Evaluation Patient Details Name: Christopher Kelley. MRN: [id number] DOB: [birth year] Today's Date: 09/15/2018   History of Present Illness  Christopher Kelley. is a 85 y.o. male with medical history significant of AV block status post pacemaker, CHF, hypertension, hyperlipidemia, BPH presenting to the hospital via EMS for evaluation of dyspnea on exertion. Admitted with CHF exacerbation.   Clinical Impression  Patient presents with decreased mobility due to generalized weakness due to bedrest and new hypoxia with ambulation on RA (desaturation to 71% on RA, Stayed at 94% with 4LPM with ambulation.)  Currently minguard for safety with ambulation, but pt feels not far off his baseline (as much as he can tell with h/o dementia).  Feel he can benefit from skilled PT in the acute setting to allow return to ALF with follow up HHPT.     Follow Up Recommendations Home health PT(at ALF)    Equipment Recommendations  None recommended by PT    Recommendations for Other Services       Precautions / Restrictions Precautions Precautions: Fall Precaution Comments: O2 dependent      Mobility  Bed Mobility Overal bed mobility: Modified Independent             General bed mobility comments: to come up to sit on stretcher and to return to supine  Transfers Overall transfer level: Needs assistance Equipment used: Rolling walker (2 wheeled) Transfers: Sit to/from Stand Sit to Stand: Min guard;From elevated surface         General transfer comment: for safety from high stretcher  Ambulation/Gait Ambulation/Gait assistance: Min guard Gait Distance (Feet): 80 Feet(x 2) Assistive device: Rolling walker (2 wheeled) Gait Pattern/deviations: Step-through pattern;Decreased stride length;Wide base of support     General Gait Details: poor safety awareness with walker, assist for safety and to stay close to walker; SpO2 ambulating on RA 71%, on 4L O2 [place] 94%  Stairs             Wheelchair Mobility    Modified Rankin (Stroke Patients Only)       Balance Overall balance assessment: Needs assistance   Sitting balance-Leahy Scale: Good       Standing balance-Leahy Scale: Poor Standing balance comment: initially standing without UE support pt with legs braced against stretcher                              Pertinent Vitals/Pain Pain Assessment: No/denies pain    Home Living Family/patient expects to be discharged to:: Assisted living               Home Equipment: Walker - 2 wheels;Cane - single point      Prior Function Level of Independence: Independent with assistive device(s)         Comments: uses cane per pt     Hand Dominance        Extremity/Trunk Assessment   Upper Extremity Assessment Upper Extremity Assessment: Overall WFL for tasks assessed    Lower Extremity Assessment Lower Extremity Assessment: Overall WFL for tasks assessed    Cervical / Trunk Assessment Cervical / Trunk Assessment: Kyphotic  Communication   Communication: HOH  Cognition Arousal/Alertness: Awake/alert Behavior During Therapy: WFL for tasks assessed/performed Overall Cognitive Status: No family/caregiver present to determine baseline cognitive functioning  General Comments General comments (skin integrity, edema, etc.): Relates in detail his habits of coughing up mucous in the morning and spitting it out and feels the mucous accumulation in his sinuses is the fluid that is overloading his heart    Exercises     Assessment/Plan    PT Assessment Patient needs continued PT services  PT Problem List Decreased strength;Decreased mobility;Decreased safety awareness;Decreased balance;Decreased knowledge of use of DME;Decreased knowledge of precautions;Decreased activity tolerance       PT Treatment Interventions DME instruction;Functional mobility training;Balance  training;Patient/family education;Gait training;Therapeutic activities;Therapeutic exercise    PT Goals (Current goals can be found in the Care Plan section)  Acute Rehab PT Goals Patient Stated Goal: to get better PT Goal Formulation: With patient Time For Goal Achievement: 09/22/18 Potential to Achieve Goals: Good    Frequency Min 3X/week   Barriers to discharge        Co-evaluation               AM-PAC PT "6 Clicks" Mobility  Outcome Measure Help needed turning from your back to your side while in a flat bed without using bedrails?: None Help needed moving from lying on your back to sitting on the side of a flat bed without using bedrails?: None Help needed moving to and from a bed to a chair (including a wheelchair)?: A Little Help needed standing up from a chair using your arms (e.g., wheelchair or bedside chair)?: A Little Help needed to walk in hospital room?: A Little Help needed climbing 3-5 steps with a railing? : A Little 6 Click Score: 20    End of Session Equipment Utilized During Treatment: Gait belt;Oxygen Activity Tolerance: Patient tolerated treatment well Patient left: in bed   PT Visit Diagnosis: Other abnormalities of gait and mobility (R26.89);Muscle weakness (generalized) (M62.81)    Time: 7215-8727 PT Time Calculation (min) (ACUTE ONLY): 36 min   Charges:   PT Evaluation $PT Eval Moderate Complexity: 1 Mod PT Treatments $Gait Training: 8-22 mins        Sheran Lawless, [place] [facility] [phone] 09/15/2018   Christopher Kelley 09/15/2018, 12:30 PM

## 2018-09-15 NOTE — Progress Notes (Signed)
  Echocardiogram 2D Echocardiogram has been performed.  Delcie Roch 09/15/2018, 4:49 PM

## 2018-09-15 NOTE — ED Notes (Signed)
Breakfast tray ordered by ED secretary

## 2018-09-15 NOTE — Progress Notes (Signed)
Progress Note    Christopher Pitre Sr.  UEA:[id number] DOB: 06/06/1929  DOA: 09/14/2018 PCP: Forrest Moron, MD    Brief Narrative:   Chief complaint:   Medical records reviewed and are as summarized below:  Christopher Freestone Sr. is an 85 y.o. male with past medical history significant of systolic CHF, AV block s/p pacemaker, HTN, HLD, and BPH; who presented with complaints of shortness of breath on exertion and fatigue over the last 2 to 3 weeks.  Assessment/Plan:   Principal Problem:   Acute respiratory failure with hypoxia (HCC) Active Problems:   CHF exacerbation (HCC)   CKD (chronic kidney disease) stage 3, GFR 30-59 ml/min (HCC)   Dementia (HCC)   Chronic pain   Neuropathy   HTN (hypertension)   Overactive bladder   Depression   Anxiety   Physical deconditioning   Body mass index is 27.13 kg/m.   Acute respiratory failure with hypoxia secondary to systolic congestive heart failure: Echo from August 2017 at [facility] showing EF of 25 to 30% with severe global hypokinesis.  Patient had initially been given 60 mg of Lasix IV in the ED and 1 doses of 40 g IV during the day found to have 3.8 L of urine. - Continuous pulse oximetry with nasal cannula oxygen and - Strict I&O's - Decreased Lasix 20 mg IV twice daily  - Repeat echo showing EF of 30 to 35% with grade 1 diastolic dysfunction today - Message sent for cardiology to eval in a.m. - Continue to monitor kidney function  Suspected pneumonia: Procalcitonin level noted to be not significant to give concern for pneumonia. - Did not continue antibiotics at this time - Mucinex for nasal congestion  Essential hypertension - Continue to monitor with IV diuresis  Chronic kidney disease stage III - Recheck kidney function in a.m.  Dementia - Continue donepezil  Chronic pain - Continue Norco   Overactive bladder - Continue Myrbetriq  Physical deconditioning - Continue physical therapy  Anxiety - Continue  Ativan   Family Communication/Anticipated D/C date and plan/Code Status   DVT prophylaxis: Lovenox ordered. Code Status: Full Code.  Family Communication: No family present at bedside Disposition Plan: Likely discharge home in 2 to 3 days   Medical Consultants:    Cardiology to eval in a.m.   Anti-Infectives:    None  Subjective:   Patient complains of mucus congestion that makes it hard for him to breathe.  Objective:    Vitals:   09/15/18 0623 09/15/18 0645 09/15/18 1308 09/15/18 1359  BP: 117/76 115/80 138/88 128/81  Pulse: 92 79 92 91  Resp: 20 (!) 26 (!) 28 18  Temp:    98.2 F (36.8 C)  TempSrc:    Oral  SpO2: 93% 91% 92% 93%  Weight:    80.9 kg  Height:    5\' 8"  (1.727 m)    Intake/Output Summary (Last 24 hours) at 09/15/2018 1850 Last data filed at 09/15/2018 1800 Gross per 24 hour  Intake 250 ml  Output 4100 ml  Net -3850 ml   Filed Weights   09/14/18 1544 09/15/18 1359  Weight: 83 kg 80.9 kg    Exam: Constitutional : Elderly male in no acute distress resting on hospital stretcher Eyes: PERRL, lids and conjunctivae normal ENMT: Mucous membranes are moist. Posterior pharynx clear of any exudate or lesions.Normal dentition.  Neck: normal, supple, no masses, no thyromegaly Respiratory: Some crackles appreciated on lower lung fields.  Patient able to speak in complete  sentences. Cardiovascular: Regular rate and rhythm, no murmurs / rubs / gallops. No extremity edema. 2+ pedal pulses. No carotid bruits.  Abdomen: no tenderness, no masses palpated. No hepatosplenomegaly. Bowel sounds positive.  Musculoskeletal: no clubbing / cyanosis. No joint deformity upper and lower extremities. Good ROM, no contractures. Normal muscle tone.  Skin: no rashes, lesions, ulcers. No induration Neurologic: CN 2-12 grossly intact. Sensation intact, DTR normal. Strength 5/5 in all 4.  Psychiatric: Poor memory alert and oriented x person and place. Normal mood.     Data  Reviewed:   I have personally reviewed following labs and imaging studies:  Labs: Labs show the following:   Basic Metabolic Panel: Recent Labs  Lab 09/14/18 1721 09/15/18 0438  NA 140 138  K 4.6 4.2  CL 110 104  CO2 23 23  GLUCOSE 100* 124*  BUN 30* 28*  CREATININE 1.17 1.19  CALCIUM 9.2 9.1   GFR Estimated Creatinine Clearance: 42.3 mL/min (by C-G formula based on SCr of 1.19 mg/dL). Liver Function Tests: Recent Labs  Lab 09/14/18 1721  AST 27  ALT 20  ALKPHOS 44  BILITOT 0.6  PROT 6.2*  ALBUMIN 3.5   No results for input(s): LIPASE, AMYLASE in the last 168 hours. No results for input(s): AMMONIA in the last 168 hours. Coagulation profile No results for input(s): INR, PROTIME in the last 168 hours.  CBC: Recent Labs  Lab 09/14/18 1547  WBC 8.7  NEUTROABS 6.2  HGB 16.6  HCT 51.6  MCV 93.8  PLT 183   Cardiac Enzymes: No results for input(s): CKTOTAL, CKMB, CKMBINDEX, TROPONINI in the last 168 hours. BNP (last 3 results) No results for input(s): PROBNP in the last 8760 hours. CBG: No results for input(s): GLUCAP in the last 168 hours. D-Dimer: No results for input(s): DDIMER in the last 72 hours. Hgb A1c: No results for input(s): HGBA1C in the last 72 hours. Lipid Profile: No results for input(s): CHOL, HDL, LDLCALC, TRIG, CHOLHDL, LDLDIRECT in the last 72 hours. Thyroid function studies: No results for input(s): TSH, T4TOTAL, T3FREE, THYROIDAB in the last 72 hours.  Invalid input(s): FREET3 Anemia work up: No results for input(s): VITAMINB12, FOLATE, FERRITIN, TIBC, IRON, RETICCTPCT in the last 72 hours. Sepsis Labs: Recent Labs  Lab 09/14/18 1547 09/14/18 2013  PROCALCITON  --  <0.10  WBC 8.7  --     Microbiology No results found for this or any previous visit (from the past 240 hour(s)).  Procedures and diagnostic studies:  Dg Chest 2 View  Result Date: 09/14/2018 CLINICAL DATA:  Shortness of breath EXAM: CHEST - 2 VIEW COMPARISON:   None. FINDINGS: Left-sided pacing device. Mild cardiomegaly with vascular congestion. Probable small pleural effusions. Diffuse interstitial and ground-glass opacity, possible edema. More patchy focal airspace disease in the left mid lung and left base. No pneumothorax. IMPRESSION: 1. Cardiomegaly with vascular congestion and mild diffuse interstitial and ground-glass opacity, possible edema. Probable trace pleural effusion 2. Patchy foci of airspace disease in the left mid lung and lung base, may reflect atelectasis or superimposed infection. Electronically Signed   By: Jasmine PangKim  Fujinaga M.D.   On: 09/14/2018 16:56    Medications:   . aspirin EC  81 mg Oral Daily  . azelastine  2 spray Each Nare BID  . donepezil  10 mg Oral QHS  . enoxaparin (LOVENOX) injection  40 mg Subcutaneous Q24H  . famotidine  10 mg Oral Daily  . fluticasone  2 spray Each Nare Daily  .  furosemide  40 mg Intravenous Q12H  . gabapentin  300 mg Oral TID  . guaiFENesin  600 mg Oral BID  . loratadine  10 mg Oral Daily  . LORazepam  1 mg Oral TID  . Melatonin  3 mg Oral QHS  . mirabegron ER  25 mg Oral Daily  . mirtazapine  30 mg Oral QHS  . montelukast  10 mg Oral QHS  . multivitamins with iron  1 tablet Oral Daily  . PARoxetine  20 mg Oral QHS  . polyethylene glycol  17 g Oral Daily  . polyvinyl alcohol  2 drop Both Eyes Daily  . QUEtiapine  25 mg Oral BID  . traMADol  50 mg Oral TID   Continuous Infusions:   LOS: 0 days   Cadell Gabrielson A Alwin Lanigan  [facility]   *Please refer to [email or web address], password [email or web address] to get updated schedule on who will round on this patient, as hospitalists switch teams weekly. If 7PM-7AM, please contact night-coverage at [email or web address], password [email or web address] for any overnight needs.  09/15/2018, 6:50 PM

## 2018-09-15 NOTE — ED Notes (Signed)
Christopher Kelley (SR)-Lunch Tray Ordered @ 1230-per Clydie BraunKaren, RN-called by Marylene LandAngela

## 2018-09-16 DIAGNOSIS — I5043 Acute on chronic combined systolic (congestive) and diastolic (congestive) heart failure: Secondary | ICD-10-CM

## 2018-09-16 DIAGNOSIS — J9601 Acute respiratory failure with hypoxia: Secondary | ICD-10-CM

## 2018-09-16 LAB — BASIC METABOLIC PANEL
Anion gap: 14 (ref 5–15)
BUN: 37 mg/dL — AB (ref 8–23)
CO2: 23 mmol/L (ref 22–32)
Calcium: 9.3 mg/dL (ref 8.9–10.3)
Chloride: 101 mmol/L (ref 98–111)
Creatinine, Ser: 1.58 mg/dL — ABNORMAL HIGH (ref 0.61–1.24)
GFR calc Af Amer: 45 mL/min — ABNORMAL LOW (ref >60)
GFR calc non Af Amer: 39 mL/min — ABNORMAL LOW (ref >60)
Glucose, Bld: 116 mg/dL — ABNORMAL HIGH (ref 70–99)
Potassium: 4.2 mmol/L (ref 3.5–5.1)
Sodium: 138 mmol/L (ref 135–145)

## 2018-09-16 LAB — CBC WITH DIFFERENTIAL/PLATELET
Abs Immature Granulocytes: 0.11 10*3/uL — ABNORMAL HIGH (ref 0.00–0.07)
BASOS ABS: 0.1 10*3/uL (ref 0.0–0.1)
BASOS PCT: 1 %
Eosinophils Absolute: 0.3 10*3/uL (ref 0.0–0.5)
Eosinophils Relative: 3 %
HCT: 56.8 % — ABNORMAL HIGH (ref 39.0–52.0)
Hemoglobin: 18.5 g/dL — ABNORMAL HIGH (ref 13.0–17.0)
Immature Granulocytes: 1 %
Lymphocytes Relative: 20 %
Lymphs Abs: 2.4 10*3/uL (ref 0.7–4.0)
MCH: 30.8 pg (ref 26.0–34.0)
MCHC: 32.6 g/dL (ref 30.0–36.0)
MCV: 94.5 fL (ref 80.0–100.0)
Monocytes Absolute: 1.2 10*3/uL — ABNORMAL HIGH (ref 0.1–1.0)
Monocytes Relative: 10 %
Neutro Abs: 8.2 10*3/uL — ABNORMAL HIGH (ref 1.7–7.7)
Neutrophils Relative %: 65 %
Platelets: 220 10*3/uL (ref 150–400)
RBC: 6.01 MIL/uL — ABNORMAL HIGH (ref 4.22–5.81)
RDW: 14.5 % (ref 11.5–15.5)
WBC: 12.4 10*3/uL — ABNORMAL HIGH (ref 4.0–10.5)
nRBC: 0 % (ref 0.0–0.2)

## 2018-09-16 NOTE — Clinical Social Work Note (Signed)
Patient is a resident at [facility]. Patient only oriented to self. No supports at bedside. Left daughter a Engineer, technical sales.  Charlynn Court, CSW [phone]

## 2018-09-16 NOTE — Progress Notes (Addendum)
Pt refused all morning meds. Alert to self only. Reoriented patient that he is in the hospital. States he will not take his morning meds until he speaks with his daughter - left a voicemail.   Pt removed telemetry, refuses to allow staff to put telemetry back on. Dr. Jarvis Newcomer made aware.   Leonidas Romberg, RN

## 2018-09-16 NOTE — Plan of Care (Signed)

## 2018-09-16 NOTE — Progress Notes (Signed)
PROGRESS NOTE  Christopher Salom Sr.  KDX:[id number] DOB: 05/11/1929 DOA: 09/14/2018 PCP: Forrest Moron, MD  Outpatient Specialists: Cardiology, Dr. Rudolpho Sevin Brief Narrative: Christopher Hoganson Sr. is an 85 y.o. male with a history of chronic HFrEF, PPM for AVB, HTN, HLD, BPH and OAB, stage III CKD, and dementia who presented to the ED 1/7 with 3 weeks of progressive shortness of breath found to be hypoxic with crackles on lung exam and pulmonary edema on CXR consistent with CHF exacerbation. IV lasix was given though the patient remained hypoxic, so was admitted with cardiology consulted.  Assessment & Plan: Principal Problem:   Acute respiratory failure with hypoxia (HCC) Active Problems:   CHF exacerbation (HCC)   CKD (chronic kidney disease) stage 3, GFR 30-59 ml/min (HCC)   Dementia (HCC)   Chronic pain   Neuropathy   HTN (hypertension)   Overactive bladder   Depression   Anxiety   Physical deconditioning  Acute on chronic combined HFrEF: Difficult to determine EDW with outpatient weights from 2017 and 2019 up at 189lbs, 185lbs. Was 182lbs on admission and trending down to 171lbs today. Suspect an element of chronic weight loss, possible malnutrition. BNP 179. - Echo Aug 2017 at [facility] showed global LV hypokinesis, EF 25-30%, mild MR, normal RV size and function. Echo 09/15/18 showed EF 30-35% with septal/inferior hypokinesis, G1DD. RV now w/pacer wire, dilated with moderately reduced systolic function.  - Cardiology consulted. Will hold further lasix given AKI for now, pending their recommendations - Daily weights, strict I/O - Dietitian consult  Acute respiratory failure with hypoxia secondary to systolic congestive heart failure:  - Continue supplemental oxygen to maintain SpO2 >90%.  - Will need ambulatory pulse oximetry prior to discharge, may need to DC w/O2. Note he's been admitted with hypoxia in the past thought to be due to chronic lung disease. CTA chest dating to 2017 showed UIP pattern  of pulmonary fibrosis and probable pulmonary HTN.   Septal/inferior wall abnormalities on echocardiogram: Per cardiology, not a candidate for catheterization. No chest pain and troponin 0.02, not consistent with acute ischemia causing these changes.  - Appreciate cardiology recommendations  Possible pneumonia: Left mid/lower lung space opacities felt to be more likely due to atelectasis in the absence of fever. Procalcitonin is undetectable.  - Leukocytosis developed 1/9 with concomitant rise in all cell lines/polycythemia and AKI suspected due to hemoconcentration. Continue to monitor off antibiotics.  Essential hypertension - Continue to monitor on medications as ordered.  AKI on stage III CKD:  - Hold diuresis, monitor BMP in AM.   Dementia - Continue donepezil - Attempting to get corroborative history from family.  Chronic pain - Continue norco   Overactive bladder - Continue myrbetriq  Physical deconditioning - Continue physical therapy at ALF per PT recommendations  Anxiety - Continue ativan  DVT prophylaxis: Lovenox Code Status: Full Family Communication: None at bedside, RN called daughter, left voicemail during encounter Disposition Plan: Return to ALF once hypoxia improved, euvolemic.  Consultants:   Cardiology  Procedures:   Echocardiogram 09/15/2018: - Left ventricle: The cavity size was normal. Wall thickness was   normal. Systolic function was moderately to severely reduced. The   estimated ejection fraction was in the range of 30% to 35%. The   septal wall and inferior wall appeared severely hypokinetic.   Doppler parameters are consistent with abnormal left ventricular   relaxation (grade 1 diastolic dysfunction). - Aortic valve: There was no stenosis. - Mitral valve: There was no significant regurgitation. - Right  ventricle: The cavity size was mildly dilated. Pacer wire   or catheter noted in right ventricle. Systolic function was    moderately reduced. - Pulmonary arteries: No complete TR doppler jet so unable to   estimate PA systolic pressure. - Systemic veins: IVC not visualized.  Impressions: - Normal LV size with EF 30-35%, septal and inferior severe   hypokinesis. Mildly dilated RV with severe systolic dysfunction.   No significant valvular abnormalities.  Antimicrobials:  Ceftriaxone, azithromycin x1 at admission   Subjective: Confused, denies any trouble breathing or chest pain for what that's worth. Combative with nursing, refusing care including medications and telemetry.  Objective: Vitals:   09/16/18 0031 09/16/18 0300 09/16/18 0430 09/16/18 1128  BP: (!) 126/94  121/80 122/77  Pulse: 77  83 85  Resp: 20  20   Temp: (!) 97.5 F (36.4 C)  97.6 F (36.4 C) 98.2 F (36.8 C)  TempSrc: Oral  Oral Oral  SpO2: 94%  92% 96%  Weight:  78 kg    Height:        Intake/Output Summary (Last 24 hours) at 09/16/2018 1320 Last data filed at 09/16/2018 0600 Gross per 24 hour  Intake 120 ml  Output 1400 ml  Net -1280 ml   Filed Weights   09/14/18 1544 09/15/18 1359 09/16/18 0300  Weight: 83 kg 80.9 kg 78 kg    Gen: Irritated elderly male in no distress Pulm: Non-labored on supplemental oxygen, clear laterally, would not allow posterior exam.  CV: Regular rate and rhythm. No murmur, rub, or gallop. Trace pedal edema. GI: Abdomen soft, non-tender, non-distended, with normoactive bowel sounds. No organomegaly or masses felt. Ext: Warm, no deformities Skin: No rashes, lesions or ulcers Neuro: Alert, not oriented, not cooperative with exam but moving all extremities. Psych: Judgement and insight appear impaired. Combative.  Data Reviewed: I have personally reviewed following labs and imaging studies  CBC: Recent Labs  Lab 09/14/18 1547 09/16/18 0412  WBC 8.7 12.4*  NEUTROABS 6.2 8.2*  HGB 16.6 18.5*  HCT 51.6 56.8*  MCV 93.8 94.5  PLT 183 220   Basic Metabolic Panel: Recent Labs  Lab  09/14/18 1721 09/15/18 0438 09/16/18 0412  NA 140 138 138  K 4.6 4.2 4.2  CL 110 104 101  CO2 23 23 23   GLUCOSE 100* 124* 116*  BUN 30* 28* 37*  CREATININE 1.17 1.19 1.58*  CALCIUM 9.2 9.1 9.3   GFR: Estimated Creatinine Clearance: 31.9 mL/min (A) (by C-G formula based on SCr of 1.58 mg/dL (H)). Liver Function Tests: Recent Labs  Lab 09/14/18 1721  AST 27  ALT 20  ALKPHOS 44  BILITOT 0.6  PROT 6.2*  ALBUMIN 3.5   No results for input(s): LIPASE, AMYLASE in the last 168 hours. No results for input(s): AMMONIA in the last 168 hours. Coagulation Profile: No results for input(s): INR, PROTIME in the last 168 hours. Cardiac Enzymes: No results for input(s): CKTOTAL, CKMB, CKMBINDEX, TROPONINI in the last 168 hours. BNP (last 3 results) No results for input(s): PROBNP in the last 8760 hours. HbA1C: No results for input(s): HGBA1C in the last 72 hours. CBG: No results for input(s): GLUCAP in the last 168 hours. Lipid Profile: No results for input(s): CHOL, HDL, LDLCALC, TRIG, CHOLHDL, LDLDIRECT in the last 72 hours. Thyroid Function Tests: No results for input(s): TSH, T4TOTAL, FREET4, T3FREE, THYROIDAB in the last 72 hours. Anemia Panel: No results for input(s): VITAMINB12, FOLATE, FERRITIN, TIBC, IRON, RETICCTPCT in the last 72 hours.  Urine analysis:    Component Value Date/Time   COLORURINE YELLOW 06/28/2018 1815   APPEARANCEUR CLEAR 06/28/2018 1815   LABSPEC 1.012 06/28/2018 1815   PHURINE 5.0 06/28/2018 1815   GLUCOSEU NEGATIVE 06/28/2018 1815   HGBUR NEGATIVE 06/28/2018 1815   BILIRUBINUR NEGATIVE 06/28/2018 1815   KETONESUR NEGATIVE 06/28/2018 1815   PROTEINUR NEGATIVE 06/28/2018 1815   NITRITE NEGATIVE 06/28/2018 1815   LEUKOCYTESUR NEGATIVE 06/28/2018 1815   No results found for this or any previous visit (from the past 240 hour(s)).    Radiology Studies: Dg Chest 2 View  Result Date: 09/14/2018 CLINICAL DATA:  Shortness of breath EXAM: CHEST - 2 VIEW  COMPARISON:  None. FINDINGS: Left-sided pacing device. Mild cardiomegaly with vascular congestion. Probable small pleural effusions. Diffuse interstitial and ground-glass opacity, possible edema. More patchy focal airspace disease in the left mid lung and left base. No pneumothorax. IMPRESSION: 1. Cardiomegaly with vascular congestion and mild diffuse interstitial and ground-glass opacity, possible edema. Probable trace pleural effusion 2. Patchy foci of airspace disease in the left mid lung and lung base, may reflect atelectasis or superimposed infection. Electronically Signed   By: Jasmine Pang M.D.   On: 09/14/2018 16:56    Scheduled Meds: . aspirin EC  81 mg Oral Daily  . azelastine  2 spray Each Nare BID  . donepezil  10 mg Oral QHS  . enoxaparin (LOVENOX) injection  40 mg Subcutaneous Q24H  . famotidine  10 mg Oral Daily  . fluticasone  2 spray Each Nare Daily  . gabapentin  300 mg Oral TID  . guaiFENesin  600 mg Oral BID  . loratadine  10 mg Oral Daily  . LORazepam  1 mg Oral TID  . Melatonin  3 mg Oral QHS  . mirabegron ER  25 mg Oral Daily  . mirtazapine  30 mg Oral QHS  . montelukast  10 mg Oral QHS  . multivitamins with iron  1 tablet Oral Daily  . PARoxetine  20 mg Oral QHS  . polyethylene glycol  17 g Oral Daily  . polyvinyl alcohol  2 drop Both Eyes Daily  . QUEtiapine  25 mg Oral BID  . traMADol  50 mg Oral TID   Continuous Infusions:   LOS: 1 day   Time spent: 25 minutes.  Tyrone Nine, MD [facility] [email or web address] Password [email or web address] 09/16/2018, 1:20 PM

## 2018-09-16 NOTE — Consult Note (Addendum)
Cardiology Consultation:   Patient ID: Christopher Freestone Sr. MRN: [id number]; DOB: 02/12/1929  Admit date: 09/14/2018 Date of Consult: 09/16/2018  Primary Care Provider: Forrest Moron, MD Primary Cardiologist: Sandy Salaam, MD   Patient Profile:   Christopher Santillanes Sr. is a 85 y.o. male with a hx of chronic systolic heart failure, complete heart block status post [employer] BiV pacemaker 05/2016, hypertension, hyperlipidemia and severe dementia who is being seen today for the evaluation of CHF at the request of Dr. Jarvis Newcomer.  He was last seen by Dr. Rudolpho Sevin November 2017 per note in care everywhere.  EF of 25 to 30%.  Per note cardiac catheterization was normal.  Echo 04/2016 Summary Moderately dilated left ventricle. Severe global LV hypokinesis. Ejection fraction is visually estimated at 25-30% Mild mitral regurgitation. Signature ------------------------------------------------------------------------------  Electronically signed by Vernona Rieger, MD, FACC(Interpreting physician) on  04/29/2016 03:37 PM ------------------------------------------------------------------------------ Findings Mitral Valve Structurally normal mitral valve. Mild mitral regurgitation. Aortic Valve Structurally normal aortic valve with good leaflet mobility, and no regurgitation. Tricuspid Valve Tricuspid valve is structurally normal. Mild tricuspid regurgitation. Mild pulmonary hypertension. Estimated pulmonary artery pressure is . Pulmonic Valve Pulmonic valve is structurally normal. No Doppler evidence of pulmonic stenosis or insufficiency. Left Atrium Normal size left atrium. Left Ventricle Moderately dilated left ventricle. Severe global LV hypokinesis. Ejection fraction is visually estimated at 25-30% Right Atrium Normal right atrium. Right Ventricle Normal right ventricular size and function. Pericardial Effusion No evidence of pericardial effusion. Miscellaneous The aorta is within normal  limits. The IVC is normal M-Mode/2D Measurements & Calculations  LV Diastolic Dimension: LV Systolic Dimension:   LA Dimension: 3.61 cmAO  5.46 cm         4.03 cm          Root Dimension: 3.41 cm  LV FS:26.19 %      LV Volume Diastolic: 113  LV PW Diastolic: 1.22 cm ml  Septum Diastolic: 1.01  LV Volume Systolic: 85.5  cm            ml              LV EDV/LV EDV Index: 113  LA/Aorta: 1.06              ml/57 m2LV ESV/LV ESV              Index: 85.5 ml/43 m2              EF Calculated: 24.34 %              EF Estimated: 25 % Doppler Measurements & Calculations  MV Peak E-Wave: 55.8 cm/s  MV Peak A-Wave: 79.8 cm/s  Estimated RVSP: 46 mmHg  MV E/A Ratio: 0.7      Estimated RAP:10 mmHg  MV Peak Gradient: 1.25 mmHg  TDI E' Velocity: 14.1 cm/s  PV Peak Velocity: 113 cm/s                PV Peak Gradient: 5.11 mmHg  E/E' prime 3.9                PR ED Velocity: 102 cm/s [address] [place], [place] [address] [phone]  History of Present Illness:   Christopher Kelley has baseline dementia and currently in delibrium.  History obtained from reviewing chart.  No family member at bedside.  He is only oriented to his name.  Says he is at " home".  Not oriented to Time.  Refuse physical exam.  Presented by EMS for  evaluation of dyspnea 1/7.  He was noted hypoxic and placed on supplemental oxygen.  Chest x-ray with possible pneumonia and possible interstitial edema.  He is on broad-spectrum antibiotic.  BNP 179.  So far he was treated with IV Lasix 60 mg x 2.  Need I&O negative 4.2 L.  However creatinine trending up 1.17>> 1.19>> 1.58 today with BNP of 37.  Troponin negative.  Mild leukocytosis.  Echocardiogram this admission showed minimally improved LV function to 30 to 35% with septal inferior severe hypokinesis, mildly dilated RV with severe systolic dysfunction.   Now cardiology is asked for further evaluation.  Past Medical History:  Diagnosis Date  . Acid reflux   . Anxiety   . AV block   . BPH (benign prostatic hyperplasia)   . CHF (congestive heart failure) (HCC)   . Depression   . High cholesterol   . Hypertension   . Hypoxia   . Pacemaker     Past Surgical History:  Procedure Laterality Date  . CHOLECYSTECTOMY       Inpatient Medications: Scheduled Meds: . aspirin EC  81 mg Oral Daily  . azelastine  2 spray Each Nare BID  . donepezil  10 mg Oral QHS  . enoxaparin (LOVENOX) injection  40 mg Subcutaneous Q24H  . famotidine  10 mg Oral Daily  . fluticasone  2 spray Each Nare Daily  . gabapentin  300 mg Oral TID  . guaiFENesin  600 mg Oral BID  . loratadine  10 mg Oral Daily  . LORazepam  1 mg Oral TID  . Melatonin  3 mg Oral QHS  . mirabegron ER  25 mg Oral Daily  . mirtazapine  30 mg Oral QHS  . montelukast  10 mg Oral QHS  . multivitamins with iron  1 tablet Oral Daily  . PARoxetine  20 mg Oral QHS  . polyethylene glycol  17 g Oral Daily  . polyvinyl alcohol  2 drop Both Eyes Daily  . QUEtiapine  25 mg Oral BID  . traMADol  50 mg Oral TID   Continuous Infusions:  PRN Meds: acetaminophen, hydrocerin, HYDROcodone-acetaminophen, ipratropium, magnesium hydroxide  Allergies:    Allergies  Allergen Reactions  . Amoxicillin Hives  . Doxycycline Rash    Social History:   Social History   Socioeconomic History  . Marital status: Widowed    Spouse name: Not on file  . Number of children: Not on file  . Years of education: Not on file  . Highest education level: Not on file  Occupational History  . Not on file  Social Needs  . Financial resource strain: Not on file  . Food insecurity:    Worry: Not on file    Inability: Not on file  . Transportation needs:    Medical: Not on file    Non-medical: Not on file  Tobacco Use  . Smoking status: Former Games developer  . Smokeless tobacco: Never Used  Substance and  Sexual Activity  . Alcohol use: No  . Drug use: No  . Sexual activity: Not on file  Lifestyle  . Physical activity:    Days per week: Not on file    Minutes per session: Not on file  . Stress: Not on file  Relationships  . Social connections:    Talks on phone: Not on file    Gets together: Not on file    Attends religious service: Not on file    Active member of club or organization: Not  on file    Attends meetings of clubs or organizations: Not on file    Relationship status: Not on file  . Intimate partner violence:    Fear of current or ex partner: Not on file    Emotionally abused: Not on file    Physically abused: Not on file    Forced sexual activity: Not on file  Other Topics Concern  . Not on file  Social History Narrative  . Not on file    Family History:   Patient is not able to provide family history due to delirium ROS:  Please see the history of present illness.  All other ROS reviewed and negative.     Physical Exam/Data:   Vitals:   09/16/18 0031 09/16/18 0300 09/16/18 0430 09/16/18 1128  BP: (!) 126/94  121/80 122/77  Pulse: 77  83 85  Resp: 20  20   Temp: (!) 97.5 F (36.4 C)  97.6 F (36.4 C) 98.2 F (36.8 C)  TempSrc: Oral  Oral Oral  SpO2: 94%  92% 96%  Weight:  78 kg    Height:        Intake/Output Summary (Last 24 hours) at 09/16/2018 1357 Last data filed at 09/16/2018 0600 Gross per 24 hour  Intake 120 ml  Output 1400 ml  Net -1280 ml   Filed Weights   09/14/18 1544 09/15/18 1359 09/16/18 0300  Weight: 83 kg 80.9 kg 78 kg   Body mass index is 26.14 kg/m.  General: Ill-appearing pale male in no acute distress Neuro: Delirium Psych: Dementia and delirium  Patient declined physical examination  EKG:  The EKG was personally reviewed and demonstrates: Sinus rhythm with Q wave inferior laterally which was present on prior EKG 11/2016 Telemetry:  Telemetry was personally reviewed and demonstrates: Sinus rhythm  Relevant CV  Studies: As summarized above  Laboratory Data:  Chemistry Recent Labs  Lab 09/14/18 1721 09/15/18 0438 09/16/18 0412  NA 140 138 138  K 4.6 4.2 4.2  CL 110 104 101  CO2 23 23 23   GLUCOSE 100* 124* 116*  BUN 30* 28* 37*  CREATININE 1.17 1.19 1.58*  CALCIUM 9.2 9.1 9.3  GFRNONAA 56* 55* 39*  GFRAA >60 >60 45*  ANIONGAP 7 11 14     Recent Labs  Lab 09/14/18 1721  PROT 6.2*  ALBUMIN 3.5  AST 27  ALT 20  ALKPHOS 44  BILITOT 0.6   Hematology Recent Labs  Lab 09/14/18 1547 09/16/18 0412  WBC 8.7 12.4*  RBC 5.50 6.01*  HGB 16.6 18.5*  HCT 51.6 56.8*  MCV 93.8 94.5  MCH 30.2 30.8  MCHC 32.2 32.6  RDW 14.9 14.5  PLT 183 220   Cardiac EnzymesNo results for input(s): TROPONINI in the last 168 hours.  Recent Labs  Lab 09/14/18 1603  TROPIPOC 0.02    BNP Recent Labs  Lab 09/14/18 1547  BNP 179.1*    DDimer No results for input(s): DDIMER in the last 168 hours.  Radiology/Studies:  Dg Chest 2 View  Result Date: 09/14/2018 CLINICAL DATA:  Shortness of breath EXAM: CHEST - 2 VIEW COMPARISON:  None. FINDINGS: Left-sided pacing device. Mild cardiomegaly with vascular congestion. Probable small pleural effusions. Diffuse interstitial and ground-glass opacity, possible edema. More patchy focal airspace disease in the left mid lung and left base. No pneumothorax. IMPRESSION: 1. Cardiomegaly with vascular congestion and mild diffuse interstitial and ground-glass opacity, possible edema. Probable trace pleural effusion 2. Patchy foci of airspace disease in the left  mid lung and lung base, may reflect atelectasis or superimposed infection. Electronically Signed   By: Jasmine PangKim  Fujinaga M.D.   On: 09/14/2018 16:56    Assessment and Plan:   1. Chronic systolic heart failure -BNP 179.  Chest x-ray with possible pneumonia and interstitial edema.  He diuresed 4.2L so far on IV Lasix 60 mg x2.  BUN/ Creatinine/creatinine rising 28/1.19>> 37/1.58.  Patient is laying comfortably on  flat bed.  Unable to examine due to decline in the Delirium.  Seems patient is going on dry side.  Avoid further diuresis. -His echocardiogram showed improved LV function ro 30 to 35% with septal inferior severe hypokinesis, mildly dilated RV with severe systolic dysfunction.   -Patient had a prior normal cardiac catheterization per note as summarized above.  He is not a candidate for any invasive or non-invasive evaluation given severe dementia and acute delirium. - Unable to put guideline directed medical management due to dementia and delirium.  Unknown baseline status. Consider low dose BB as blood pressure allows if able to tolerate.  Recommended discussing goal of cares.   2. CHB s/p BiV pacemaker  3.  Acute delirium -Primary team                                                                            4.  Possible pneumonia -Per primary team  [facility] will sign off.   Medication Recommendations:  As summarized above Other recommendations (labs, testing, etc):  As above Follow up as an outpatient: With his primary cardiologist Dr. Rudolpho SevinAkbary   For questions or updates, please contact [facility] Please consult [email or web address] for contact info under     SignedManson Passey, Bhavinkumar Bhagat, PA  09/16/2018 1:57 PM   ---------------------------------------------------------------------------------------------   History and all data above reviewed.  Patient examined.  I agree with the findings as above.  Christopher FreestoneJoseph Craton Sr. is an 85 year old male   Constitutional: Confused, requesting Foley catheter.  Neuro: not oriented to person, place, or time.   All available labs, radiology testing, previous records reviewed. Agree with documented assessment and plan of my colleague as stated above with the following additions or changes:  Principal Problem:   Acute respiratory failure with hypoxia (HCC) Active Problems:   CHF exacerbation (HCC)   CKD (chronic kidney disease) stage 3, GFR  30-59 ml/min (HCC)   Dementia (HCC)   Chronic pain   Neuropathy   HTN (hypertension)   Overactive bladder   Depression   Anxiety   Physical deconditioning   Plan: The patient is clearly experiencing delirium/dementia.  He has refused physical exam.  It will be very difficult to escalate therapy for heart failure given his overall mental status.  Due to overall medical frailty I am concerned about the initiation of guideline directed ACE/arb.  With a pacemaker in place could consider initiation of beta-blocker, however this should be done with caution for hypotension.  I believe that a goals of care discussion is paramount to his overall care.   He has been diuresed significantly thusfar. Unlike to require further diuresis at this time.   Remainder per IM.   Length of Stay:  LOS: 1 day   Parke PoissonGayatri A Acharya, MD  [facility] 2:57 PM  09/16/2018

## 2018-09-17 LAB — BASIC METABOLIC PANEL
Anion gap: 13 (ref 5–15)
BUN: 54 mg/dL — ABNORMAL HIGH (ref 8–23)
CO2: 23 mmol/L (ref 22–32)
Calcium: 9.5 mg/dL (ref 8.9–10.3)
Chloride: 102 mmol/L (ref 98–111)
Creatinine, Ser: 1.52 mg/dL — ABNORMAL HIGH (ref 0.61–1.24)
GFR calc Af Amer: 47 mL/min — ABNORMAL LOW (ref >60)
GFR calc non Af Amer: 41 mL/min — ABNORMAL LOW (ref >60)
Glucose, Bld: 125 mg/dL — ABNORMAL HIGH (ref 70–99)
Potassium: 4.1 mmol/L (ref 3.5–5.1)
Sodium: 138 mmol/L (ref 135–145)

## 2018-09-17 MED ORDER — LORAZEPAM 1 MG PO TABS: 1.0000 mg | ORAL_TABLET | Freq: Three times a day (TID) | ORAL | 0 refills | Status: AC

## 2018-09-17 MED ORDER — HYDROCODONE-ACETAMINOPHEN 5-325 MG PO TABS: 1.0000 | ORAL_TABLET | Freq: Four times a day (QID) | ORAL | 0 refills | Status: AC | PRN

## 2018-09-17 MED ORDER — TRAMADOL HCL 50 MG PO TABS: 50.0000 mg | ORAL_TABLET | Freq: Three times a day (TID) | ORAL | 0 refills | Status: AC

## 2018-09-17 MED ORDER — CARVEDILOL 3.125 MG PO TABS: 3.1250 mg | ORAL_TABLET | Freq: Two times a day (BID) | ORAL | Status: DC

## 2018-09-17 NOTE — Care Management Important Message (Signed)
Important Message  Patient Details  Name: Christopher Bardi Sr. MRN: [id number] Date of Birth: [birth year]   Medicare Important Message Given:  Yes    Wolfe Camarena P Kylah Maresh 09/17/2018, 11:49 AM

## 2018-09-17 NOTE — Progress Notes (Signed)
SATURATION QUALIFICATIONS: (This note is used to comply with regulatory documentation for home oxygen)  Patient Saturations on Room Air at Rest = 85%  Patient Saturations on Room Air while Ambulating = did not test due to pt desat on RA at rest.  Patient Saturations on 4 Liters of oxygen while Ambulating = 93%  Please briefly explain why patient needs home oxygen:Pt desats on RA at rest.  Will need home O2.  Needed 4L with ambulation to keep sats >90%.  Thanks.   Devonia Farro,PT Acute Rehabilitation Services Pager:  [phone]  Office:  [phone]

## 2018-09-17 NOTE — Clinical Social Work Note (Addendum)
Faxed FL2, discharge summary, and therapy notes to [facility]F. Will follow up in a few minutes to make sure they got the paperwork. Will have MD cosign FL2 once it is determined no changes need to be made.  Charlynn Court, CSW [phone]  2:01 pm RN at ALF is at lunch so she has not reviewed clinicals that were faxed over yet. Oxygen has not been delivered yet either. ALF will call CSW with updates.  Charlynn Court, CSW [phone]  3:55 pm Made requested change to [facility] and had MD sign before faxing to ALF. Oxygen has not been delivered yet. [facility] will check status.  Charlynn Court, CSW [phone]

## 2018-09-17 NOTE — Clinical Social Work Note (Signed)
CSW facilitated patient discharge including contacting patient family and facility to confirm patient discharge plans. Clinical information faxed to facility and family agreeable with plan. RN will arrange ambulance transport via [facility] to [facility] once they call to confirm that oxygen has been delivered (scheduled between 3:00 and 7:00 today). RN to call report prior to discharge [phone]).  To set up transport: Call [phone]. Tell them you are calling to arrange transport from [facility] to [facility]. Provide room number, patient's name, date of birth (1929/02/11), social security number ([phone]). He does need oxygen, has a DNR, and weighs 173 lbs.  Please call daughter when transport is being arranged: [phone].   CSW will sign off for now as social work intervention is no longer needed. Please consult [place] again if new needs arise.  Charlynn Court, CSW [phone]

## 2018-09-17 NOTE — NC FL2 (Addendum)
[place] MEDICAID FL2 LEVEL OF CARE SCREENING TOOL     IDENTIFICATION  Patient Name: Christopher FreestoneJoseph Files Sr. Birthdate: [birth year] Sex: male Admission Date (Current Location): 09/14/2018  [facility] [place]   Facility and Address:  [facility], [address], [place], [place] [address]      Provider Number: [id number]  Attending Physician Name and Address:  Tyrone NineGrunz, Ryan B, MD  Relative Name and Phone Number:       Current Level of Care: Hospital Recommended Level of Care: Assisted Living Facility(with PT.) Prior Approval Number:    Date Approved/Denied:   PASRR Number:    Discharge Plan: Other (Comment)(ALF with PT.)    Current Diagnoses: Patient Active Problem List   Diagnosis Date Noted  . Acute respiratory failure with hypoxia (HCC) 09/14/2018  . CHF exacerbation (HCC) 09/14/2018  . CKD (chronic kidney disease) stage 3, GFR 30-59 ml/min (HCC) 09/14/2018  . Dementia (HCC) 09/14/2018  . Chronic pain 09/14/2018  . Neuropathy 09/14/2018  . HTN (hypertension) 09/14/2018  . Overactive bladder 09/14/2018  . Depression 09/14/2018  . Anxiety 09/14/2018  . Physical deconditioning 09/14/2018  . Hypoxia 12/21/2017  . Chronic rhinitis 09/10/2017    Orientation RESPIRATION BLADDER Height & Weight     Self  O2(Nasal Canula 4 L) Continent Weight: 173 lb 12.8 oz (78.8 kg) Height:  5\' 8"  (172.7 cm)  BEHAVIORAL SYMPTOMS/MOOD NEUROLOGICAL BOWEL NUTRITION STATUS  (None) (Dementia) Continent Diet (Regular)  AMBULATORY STATUS COMMUNICATION OF NEEDS Skin   Limited Assist Verbally Skin abrasions, Other (Comment)(MASD.)                       Personal Care Assistance Level of Assistance              Functional Limitations Info  Sight, Hearing, Speech Sight Info: Adequate Hearing Info: Adequate Speech Info: Adequate    SPECIAL CARE FACTORS FREQUENCY  PT (By licensed PT)     PT Frequency: 3 x week               Contractures Contractures Info: Not present    Additional Factors Info  Code Status, Allergies Code Status Info: DNR Allergies Info: Amoxicillin, Doxycycline.           Current Medications (09/17/2018):  This is the current hospital active medication list Current Facility-Administered Medications  Medication Dose Route Frequency Provider Last Rate Last Dose  . acetaminophen (TYLENOL) tablet 650 mg  650 mg Oral Q6H PRN John Giovanniathore, Vasundhra, MD      . aspirin EC tablet 81 mg  81 mg Oral Daily John Giovanniathore, Vasundhra, MD   81 mg at 09/17/18 0959  . azelastine (ASTELIN) 0.1 % nasal spray 2 spray  2 spray Each Nare BID John Giovanniathore, Vasundhra, MD   2 spray at 09/17/18 0957  . donepezil (ARICEPT) tablet 10 mg  10 mg Oral QHS John Giovanniathore, Vasundhra, MD   10 mg at 09/16/18 2115  . enoxaparin (LOVENOX) injection 40 mg  40 mg Subcutaneous Q24H John Giovanniathore, Vasundhra, MD   40 mg at 09/16/18 2106  . famotidine (PEPCID) tablet 10 mg  10 mg Oral Daily John Giovanniathore, Vasundhra, MD   10 mg at 09/17/18 0958  . fluticasone (FLONASE) 50 MCG/ACT nasal spray 2 spray  2 spray Each Nare Daily John Giovanniathore, Vasundhra, MD   2 spray at 09/17/18 0957  . gabapentin (NEURONTIN) capsule 300 mg  300 mg Oral TID John Giovanniathore, Vasundhra, MD   300 mg at  09/17/18 0958  . guaiFENesin (MUCINEX) 12 hr tablet 600 mg  600 mg Oral BID Madelyn Flavors A, MD   600 mg at 09/16/18 2108  . hydrocerin (EUCERIN) cream   Topical BID PRN John Giovanni, MD      . HYDROcodone-acetaminophen (NORCO/VICODIN) 5-325 MG per tablet 1 tablet  1 tablet Oral Q6H PRN John Giovanni, MD      . ipratropium (ATROVENT) 0.03 % nasal spray 2 spray  2 spray Each Nare Q6H PRN John Giovanni, MD      . loratadine (CLARITIN) tablet 10 mg  10 mg Oral Daily John Giovanni, MD   10 mg at 09/17/18 0959  . LORazepam (ATIVAN) tablet 1 mg  1 mg Oral TID John Giovanni, MD   1 mg at 09/17/18 0959  . magnesium hydroxide (MILK OF MAGNESIA) suspension 30 mL  30 mL Oral Daily PRN  John Giovanni, MD      . Melatonin TABS 3 mg  3 mg Oral QHS John Giovanni, MD   3 mg at 09/16/18 2107  . mirabegron ER (MYRBETRIQ) tablet 25 mg  25 mg Oral Daily John Giovanni, MD   25 mg at 09/17/18 0959  . mirtazapine (REMERON) tablet 30 mg  30 mg Oral QHS John Giovanni, MD   30 mg at 09/16/18 2107  . montelukast (SINGULAIR) tablet 10 mg  10 mg Oral QHS John Giovanni, MD   10 mg at 09/16/18 2108  . multivitamins with iron tablet 1 tablet  1 tablet Oral Daily John Giovanni, MD   1 tablet at 09/17/18 0959  . PARoxetine (PAXIL) tablet 20 mg  20 mg Oral QHS John Giovanni, MD   20 mg at 09/16/18 2109  . polyethylene glycol (MIRALAX / GLYCOLAX) packet 17 g  17 g Oral Daily John Giovanni, MD   17 g at 09/17/18 0958  . polyvinyl alcohol (LIQUIFILM TEARS) 1.4 % ophthalmic solution 2 drop  2 drop Both Eyes Daily John Giovanni, MD   2 drop at 09/17/18 0958  . QUEtiapine (SEROQUEL) tablet 25 mg  25 mg Oral BID John Giovanni, MD   25 mg at 09/17/18 0959  . traMADol (ULTRAM) tablet 50 mg  50 mg Oral TID John Giovanni, MD   50 mg at 09/17/18 8101     Discharge Medications: STOP taking these medications       meloxicam 15 MG tablet Commonly known as:  MOBIC             TAKE these medications       acetaminophen 325 MG tablet Commonly known as:  TYLENOL Take 2 tablets (650 mg total) by mouth every 6 (six) hours as needed. What changed:  reasons to take this   aspirin EC 81 MG tablet Take 81 mg by mouth daily.   azelastine 0.1 % nasal spray Commonly known as:  ASTELIN Place 2 sprays into both nostrils 2 (two) times daily. Use in each nostril as directed What changed:  additional instructions   carvedilol 3.125 MG tablet Commonly known as:  COREG Take 1 tablet (3.125 mg total) by mouth 2 (two) times daily.   cetirizine 10 MG tablet Commonly known as:  ZYRTEC Take 1 tablet (10 mg total) by mouth daily.   Cranberry 450 MG  Tabs Take 450 mg by mouth daily.   donepezil 10 MG tablet Commonly known as:  ARICEPT Take 10 mg by mouth at bedtime.   eucerin cream Apply 1 application topically See admin instructions. Apply behind ears topically  every 12 hours as needed for dry skin   fluticasone 50 MCG/ACT nasal spray Commonly known as:  FLONASE Place 2 sprays into both nostrils daily.   gabapentin 300 MG capsule Commonly known as:  NEURONTIN Take 300 mg by mouth 3 (three) times daily.   HYDROcodone-acetaminophen 5-325 MG tablet Commonly known as:  NORCO/VICODIN Take 1 tablet by mouth every 6 (six) hours as needed (for pain).   hydroxypropyl methylcellulose / hypromellose 2.5 % ophthalmic solution Commonly known as:  ISOPTO TEARS / GONIOVISC Place 2 drops into both eyes daily.   ipratropium 0.03 % nasal spray Commonly known as:  ATROVENT Place 2 sprays into both nostrils every 6 (six) hours as needed for rhinitis. What changed:  reasons to take this   LORazepam 1 MG tablet Commonly known as:  ATIVAN Take 1 tablet (1 mg total) by mouth 3 (three) times daily.   magnesium hydroxide 400 MG/5ML suspension Commonly known as:  MILK OF MAGNESIA Take 30 mLs by mouth daily as needed for mild constipation.   Melatonin 3 MG Tabs Take 3 mg by mouth at bedtime.   mirtazapine 30 MG tablet Commonly known as:  REMERON Take 30 mg by mouth at bedtime.   montelukast 10 MG tablet Commonly known as:  SINGULAIR Take 1 tablet (10 mg total) by mouth at bedtime.   MYRBETRIQ 25 MG Tb24 tablet Generic drug:  mirabegron ER Take 25 mg by mouth daily.   PARoxetine 20 MG tablet Commonly known as:  PAXIL Take 20 mg by mouth at bedtime.   polyethylene glycol packet Commonly known as:  MIRALAX / GLYCOLAX Take 17 g by mouth daily.   QUEtiapine 25 MG tablet Commonly known as:  SEROQUEL Take 25 mg by mouth 2 (two) times daily.   ranitidine 150 MG tablet Commonly known as:  ZANTAC Take 150 mg by  mouth at bedtime.   TAB-A-VITE Tabs Take 1 tablet by mouth daily.   traMADol 50 MG tablet Commonly known as:  ULTRAM Take 1 tablet (50 mg total) by mouth 3 (three) times daily.     Relevant Imaging Results:  Relevant Lab Results:   Additional Information SS#: [id number]  Margarito LinerSarah C Arnet Hofferber, LCSW

## 2018-09-17 NOTE — Clinical Social Work Note (Addendum)
Tried calling daughter again. No answer. Did not leave a second message.  Charlynn Court, CSW [phone]  10:35 am Spoke to [place] at [facility] and notified her that patient is stable for discharge today and will be returning with oxygen. She said for [facility] to order concentrator, backup tanks, and small tanks to go to dining room. RNCM aware. RNCM needs to know if patient will return by car or [facility]AR when ordering oxygen. Left daughter a Engineer, technical sales.  Charlynn Court, CSW [phone]  11:37 am Tried calling daughter again. No answer. Obtained cell phone number from ALF [phone]). Left voicemail.  Charlynn Court, CSW [phone]

## 2018-09-17 NOTE — Progress Notes (Signed)
Physical Therapy Treatment Patient Details Name: Christopher Barnhard Sr. MRN: [id number] DOB: [birth year] Today's Date: 09/17/2018    History of Present Illness Christopher Petit Sr. is a 85 y.o. male with medical history significant of AV block status post pacemaker, CHF, hypertension, hyperlipidemia, BPH presenting to the hospital via EMS for evaluation of dyspnea on exertion. Admitted with CHF exacerbation.     PT Comments    Pt admitted with above diagnosis. Pt currently with functional limitations due to balance and endurance deficits. Pt was able to ambulate with RW in halls with min guard assist and cues.  Pt not oriented to place time or situation.  Very confused.  Needs 4LO2 to keep sats >90%.  Will follow acutely.  Pt will benefit from skilled PT to increase their independence and safety with mobility to allow discharge to the venue listed below.     Follow Up Recommendations  Home health PT(at ALF)     Equipment Recommendations  None recommended by PT    Recommendations for Other Services       Precautions / Restrictions Precautions Precautions: Fall Precaution Comments: O2 dependent Restrictions Weight Bearing Restrictions: No    Mobility  Bed Mobility Overal bed mobility: Modified Independent                Transfers Overall transfer level: Needs assistance Equipment used: Rolling walker (2 wheeled) Transfers: Sit to/from Stand Sit to Stand: Min guard;From elevated surface            Ambulation/Gait Ambulation/Gait assistance: Min guard Gait Distance (Feet): 110 Feet Assistive device: Rolling walker (2 wheeled) Gait Pattern/deviations: Step-through pattern;Decreased stride length;Wide base of support   Gait velocity interpretation: <1.31 ft/sec, indicative of household ambulator General Gait Details: poor safety awareness with walker, assist for safety and to stay close to walker; SpO2 ambulating on RA 84%, on 4L O2 [place] 93%   Stairs              Wheelchair Mobility    Modified Rankin (Stroke Patients Only)       Balance Overall balance assessment: Needs assistance   Sitting balance-Leahy Scale: Good     Standing balance support: Bilateral upper extremity supported;During functional activity Standing balance-Leahy Scale: Poor Standing balance comment: needs bil UE support                            Cognition Arousal/Alertness: Awake/alert Behavior During Therapy: WFL for tasks assessed/performed Overall Cognitive Status: No family/caregiver present to determine baseline cognitive functioning                                        Exercises      General Comments        Pertinent Vitals/Pain Pain Assessment: No/denies pain    Home Living                      Prior Function            PT Goals (current goals can now be found in the care plan section) Acute Rehab PT Goals Patient Stated Goal: to get better Progress towards PT goals: Progressing toward goals    Frequency    Min 3X/week      PT Plan Current plan remains appropriate    Co-evaluation  AM-PAC PT "6 Clicks" Mobility   Outcome Measure  Help needed turning from your back to your side while in a flat bed without using bedrails?: None Help needed moving from lying on your back to sitting on the side of a flat bed without using bedrails?: None Help needed moving to and from a bed to a chair (including a wheelchair)?: A Little Help needed standing up from a chair using your arms (e.g., wheelchair or bedside chair)?: A Little Help needed to walk in hospital room?: A Little Help needed climbing 3-5 steps with a railing? : A Little 6 Click Score: 20    End of Session Equipment Utilized During Treatment: Gait belt;Oxygen Activity Tolerance: Patient tolerated treatment well;Patient limited by fatigue Patient left: in bed;with call bell/phone within reach;with bed alarm set Nurse  Communication: Mobility status PT Visit Diagnosis: Other abnormalities of gait and mobility (R26.89);Muscle weakness (generalized) (M62.81)     Time: [id number] PT Time Calculation (min) (ACUTE ONLY): 22 min  Charges:  $Gait Training: 8-22 mins                     Trayveon Beckford,PT Acute Rehabilitation Services Pager:  [phone]  Office:  [phone]     Berline LopesDawn F Stella Bortle 09/17/2018, 11:03 AM

## 2018-09-17 NOTE — Progress Notes (Signed)
Home oxygen ordered as requested and to be delivered to [facility] as requested; talked to [facility] with [facility]; order has been received and oxygen will be delivered today; he will bring 2 portable oxygen tanks to the patient's room for transportation home; B Hudson RN,MHA,BSN [phone]

## 2018-09-17 NOTE — Progress Notes (Signed)
Patient report given to nurse Charmaine at [facility]. Per nurse Charmaine, the oxygen has been delivered to [facility].  Patient was waiting for oxygen delivery to [facility] prior to discharge.  This nurse called the ambulance and set up transport for the patient at 1803.  Elnita Maxwell, RN

## 2018-09-17 NOTE — Discharge Summary (Signed)
Physician Discharge Summary  Christopher FreestoneJoseph Hoeg Sr. ZOX:[id number] DOB: [birth year] DOA: 09/14/2018  PCP: Forrest Moronuehle, Stephen, MD  Admit date: 09/14/2018 Discharge date: 09/17/2018  Admitted From: ALF Disposition: ALF   Recommendations for Outpatient Follow-up:  1. Follow up with PCP in 1-2 weeks 2. Follow up with cardiology in next 2-4 weeks 3. Please obtain BMP/CBC in one week 4. Continued goals of care discussions is paramount  Home Health: None new Equipment/Devices: Supplemental oxygen Discharge Condition: Stable CODE STATUS: DNR Diet recommendation: Heart healthy  Brief/Interim Summary: Christopher FreestoneJoseph Ludolph Sr. is an 85 y.o. male with a history of chronic HFrEF, PPM for AVB, HTN, HLD, BPH and OAB, stage III CKD, chronic lung disease, and dementia who presented to the ED 1/7 with 3 weeks of progressive shortness of breath found to be hypoxic with crackles on lung exam. BNP modestly elevated, though not peripherally volume overloaded. There was pulmonary edema on CXR consistent with CHF exacerbation. IV lasix was given though the patient remained hypoxic, so was admitted with cardiology consulted. Creatinine rose consistent with AKI so diuretics were held. No further work up or interventions were recommended by cardiology. The patient appears euvolemic without evidence of infection but remains hypoxic. 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 He is stable for discharge with follow up.   Discharge Diagnoses:  Principal Problem:   Acute respiratory failure with hypoxia (HCC) Active Problems:   CHF exacerbation (HCC)   CKD (chronic kidney disease) stage 3, GFR 30-59 ml/min (HCC)   Dementia (HCC)   Chronic pain   Neuropathy   HTN (hypertension)   Overactive bladder   Depression   Anxiety   Physical  deconditioning  Acute on chronic combined HFrEF: Outpatient weights from 2017 and 2019 up at 189lbs, 185lbs. Was 182lbs on admission and trending down to 173lbs on day of discharge. BNP 179. [facility] Aug [facility] showed global LV hypokinesis, EF 25-30%, mild MR, normal RV size and function.  - Echo 09/15/18 showed EF 30-35% with septal/inferior hypokinesis, G1DD. RV now w/pacer wire, dilated with moderately reduced systolic function.  - Will hold further lasix given AKI for now. Start low dose coreg as BP will tolerate. No ACE/ARB/ARNI with renal impairment. - Pt of Dr. Rhunette CroftAkabry's, will need to follow up there.  Acute respiratory failure with hypoxia secondary to chronic lung disease with superimposed systolic congestive heart failure: - Continue supplemental oxygen to maintain SpO2 >90%. Qualifies for home oxygen, suspect this has been the case for some time now. Note he's been admitted with hypoxia in the past thought to be due to chronic lung disease. CTA chest dating to 2017 showed UIP pattern of pulmonary fibrosis and probable pulmonary HTN.   Septal/inferior wall abnormalities on echocardiogram: Per cardiology, not a candidate for catheterization. No chest pain and troponin 0.02, not consistent with acute ischemia causing these changes.  - Appreciate cardiology recommendations. Not a candidate for catheterization. On aspirin, starting low dose coreg.   Possible pneumonia: Left mid/lower lung space opacities felt to be more likely due to atelectasis/chronic lung disease in the absence of fever. Procalcitonin is undetectable.  - Leukocytosis developed 1/9 with concomitant rise in all cell lines/polycythemia and AKI suspected due to hemoconcentration. Continue to monitor off antibiotics.  Essential hypertension -Continue to monitor on medications as ordered.  AKI on stage III  CKD:  - Holding diuresis, creatinine stable/slightly trending downward. Recommend no further diuretic on discharge  and follow up BMP in the next week.   Dementia -Continue donepezil  Chronic pain -Continue norco   Overactive bladder -Continue myrbetriq  Physical deconditioning -Continue physical therapy at ALF per PT recommendations  Anxiety -Continue ativan  Discharge Instructions Discharge Instructions    Diet - low sodium heart healthy   Complete by:  As directed    Increase activity slowly   Complete by:  As directed      Allergies as of 09/17/2018      Reactions   Amoxicillin Hives   Doxycycline Rash      Medication List    STOP taking these medications   meloxicam 15 MG tablet Commonly known as:  MOBIC     TAKE these medications   acetaminophen 325 MG tablet Commonly known as:  TYLENOL Take 2 tablets (650 mg total) by mouth every 6 (six) hours as needed. What changed:  reasons to take this   aspirin EC 81 MG tablet Take 81 mg by mouth daily.   azelastine 0.1 % nasal spray Commonly known as:  ASTELIN Place 2 sprays into both nostrils 2 (two) times daily. Use in each nostril as directed What changed:  additional instructions   carvedilol 3.125 MG tablet Commonly known as:  COREG Take 1 tablet (3.125 mg total) by mouth 2 (two) times daily.   cetirizine 10 MG tablet Commonly known as:  ZYRTEC Take 1 tablet (10 mg total) by mouth daily.   Cranberry 450 MG Tabs Take 450 mg by mouth daily.   donepezil 10 MG tablet Commonly known as:  ARICEPT Take 10 mg by mouth at bedtime.   eucerin cream Apply 1 application topically See admin instructions. Apply behind ears topically every 12 hours as needed for dry skin   fluticasone 50 MCG/ACT nasal spray Commonly known as:  FLONASE Place 2 sprays into both nostrils daily.   gabapentin 300 MG capsule Commonly known as:  NEURONTIN Take 300 mg by mouth 3 (three) times daily.   HYDROcodone-acetaminophen 5-325 MG tablet Commonly known as:  NORCO/VICODIN Take 1 tablet by mouth every 6 (six) hours as needed  (for pain).   hydroxypropyl methylcellulose / hypromellose 2.5 % ophthalmic solution Commonly known as:  ISOPTO TEARS / GONIOVISC Place 2 drops into both eyes daily.   ipratropium 0.03 % nasal spray Commonly known as:  ATROVENT Place 2 sprays into both nostrils every 6 (six) hours as needed for rhinitis. What changed:  reasons to take this   LORazepam 1 MG tablet Commonly known as:  ATIVAN Take 1 tablet (1 mg total) by mouth 3 (three) times daily.   magnesium hydroxide 400 MG/5ML suspension Commonly known as:  MILK OF MAGNESIA Take 30 mLs by mouth daily as needed for mild constipation.   Melatonin 3 MG Tabs Take 3 mg by mouth at bedtime.   mirtazapine 30 MG tablet Commonly known as:  REMERON Take 30 mg by mouth at bedtime.   montelukast 10 MG tablet Commonly known as:  SINGULAIR Take 1 tablet (10 mg total) by mouth at bedtime.   MYRBETRIQ 25 MG Tb24 tablet Generic drug:  mirabegron ER Take 25 mg by mouth daily.   PARoxetine 20 MG tablet Commonly known as:  PAXIL Take 20 mg by mouth at bedtime.   polyethylene glycol packet Commonly known as:  MIRALAX / GLYCOLAX Take 17 g by mouth daily.   QUEtiapine 25 MG tablet  Commonly known as:  SEROQUEL Take 25 mg by mouth 2 (two) times daily.   ranitidine 150 MG tablet Commonly known as:  ZANTAC Take 150 mg by mouth at bedtime.   TAB-A-VITE Tabs Take 1 tablet by mouth daily.   traMADol 50 MG tablet Commonly known as:  ULTRAM Take 1 tablet (50 mg total) by mouth 3 (three) times daily.            Durable Medical Equipment  (From admission, onward)         Start     Ordered   09/17/18 1037  For home use only DME oxygen  Once    Question Answer Comment  Mode or (Route) Nasal cannula   Liters per Minute 4   Frequency Continuous (stationary and portable oxygen unit needed)   Oxygen conserving device Yes   Oxygen delivery system Gas      09/17/18 1036         Follow-up Information    Forrest Moron, MD.  Schedule an appointment as soon as possible for a visit in 1 week(s).   Specialty:  Internal Medicine Contact information: [address] [place] [address] [phone]        Sandy Salaam, MD Follow up.   Specialty:  Cardiology Contact information: [address] [place] [address] [phone]          Allergies  Allergen Reactions  . Amoxicillin Hives  . Doxycycline Rash    Consultations:  Cardiology, Dr. Jacques Navy  Procedures/Studies: Dg Chest 2 View  Result Date: 09/14/2018 CLINICAL DATA:  Shortness of breath EXAM: CHEST - 2 VIEW COMPARISON:  None. FINDINGS: Left-sided pacing device. Mild cardiomegaly with vascular congestion. Probable small pleural effusions. Diffuse interstitial and ground-glass opacity, possible edema. More patchy focal airspace disease in the left mid lung and left base. No pneumothorax. IMPRESSION: 1. Cardiomegaly with vascular congestion and mild diffuse interstitial and ground-glass opacity, possible edema. Probable trace pleural effusion 2. Patchy foci of airspace disease in the left mid lung and lung base, may reflect atelectasis or superimposed infection. Electronically Signed   By: Jasmine Pang M.D.   On: 09/14/2018 16:56     Echocardiogram 09/15/2018: - Left ventricle: The cavity size was normal. Wall thickness was normal. Systolic function was moderately to severely reduced. The estimated ejection fraction was in the range of 30% to 35%. The septal wall and inferior wall appeared severely hypokinetic. Doppler parameters are consistent with abnormal left ventricular relaxation (grade 1 diastolic dysfunction). - Aortic valve: There was no stenosis. - Mitral valve: There was no significant regurgitation. - Right ventricle: The cavity size was mildly dilated. Pacer wire or catheter noted in right ventricle. Systolic function was moderately reduced. - Pulmonary arteries: No complete TR doppler jet  so unable to estimate PA systolic pressure. - Systemic veins: IVC not visualized.  Impressions: - Normal LV size with EF 30-35%, septal and inferior severe hypokinesis. Mildly dilated RV with severe systolic dysfunction. No significant valvular abnormalities.  Subjective: Confused. Denies any complaints except for a hump in his bed causing pain in the middle of his back. This was resolved with repositioning by the nurse.  Discharge Exam: Vitals:   09/17/18 0513 09/17/18 1007  BP: 123/87 126/77  Pulse: 83 66  Resp: 20 18  Temp: (!) 97.4 F (36.3 C)   SpO2: 94% 97%   General: Pt is alert, awake, not in acute distress. Confused. Refuses any auscultation but has no JVD,  no LE edema and no audible wheezing, nonlabored breathing.   Labs: BNP (last 3 results) Recent Labs    12/21/17 1033 09/14/18 1547  BNP 38.7 179.1*   Basic Metabolic Panel: Recent Labs  Lab 09/14/18 1721 09/15/18 0438 09/16/18 0412 09/17/18 0554  NA 140 138 138 138  K 4.6 4.2 4.2 4.1  CL 110 104 101 102  CO2 23 23 23 23   GLUCOSE 100* 124* 116* 125*  BUN 30* 28* 37* 54*  CREATININE 1.17 1.19 1.58* 1.52*  CALCIUM 9.2 9.1 9.3 9.5   Liver Function Tests: Recent Labs  Lab 09/14/18 1721  AST 27  ALT 20  ALKPHOS 44  BILITOT 0.6  PROT 6.2*  ALBUMIN 3.5   No results for input(s): LIPASE, AMYLASE in the last 168 hours. No results for input(s): AMMONIA in the last 168 hours. CBC: Recent Labs  Lab 09/14/18 1547 09/16/18 0412  WBC 8.7 12.4*  NEUTROABS 6.2 8.2*  HGB 16.6 18.5*  HCT 51.6 56.8*  MCV 93.8 94.5  PLT 183 220   Cardiac Enzymes: No results for input(s): CKTOTAL, CKMB, CKMBINDEX, TROPONINI in the last 168 hours. BNP: Invalid input(s): POCBNP CBG: No results for input(s): GLUCAP in the last 168 hours. D-Dimer No results for input(s): DDIMER in the last 72 hours. Hgb A1c No results for input(s): HGBA1C in the last 72 hours. Lipid Profile No results for input(s): CHOL,  HDL, LDLCALC, TRIG, CHOLHDL, LDLDIRECT in the last 72 hours. Thyroid function studies No results for input(s): TSH, T4TOTAL, T3FREE, THYROIDAB in the last 72 hours.  Invalid input(s): FREET3 Anemia work up No results for input(s): VITAMINB12, FOLATE, FERRITIN, TIBC, IRON, RETICCTPCT in the last 72 hours. Urinalysis    Component Value Date/Time   COLORURINE YELLOW 06/28/2018 1815   APPEARANCEUR CLEAR 06/28/2018 1815   LABSPEC 1.012 06/28/2018 1815   PHURINE 5.0 06/28/2018 1815   GLUCOSEU NEGATIVE 06/28/2018 1815   HGBUR NEGATIVE 06/28/2018 1815   BILIRUBINUR NEGATIVE 06/28/2018 1815   KETONESUR NEGATIVE 06/28/2018 1815   PROTEINUR NEGATIVE 06/28/2018 1815   NITRITE NEGATIVE 06/28/2018 1815   LEUKOCYTESUR NEGATIVE 06/28/2018 1815    Microbiology No results found for this or any previous visit (from the past 240 hour(s)).  Time coordinating discharge: Approximately 40 minutes  Tyrone Nine, MD  [facility] 09/17/2018, 12:53 PM Pager [phone]

## 2018-09-17 NOTE — Clinical Social Work Note (Signed)
Clinical Social Work Assessment  Patient Details  Name: Christopher Kelley. MRN: [id number] Date of Birth: 1928-12-28  Date of referral:  09/17/18               Reason for consult:  Discharge Planning                Permission sought to share information with:  Facility Medical sales representative, Family Supports Permission granted to share information::     Name::     Christopher Kelley  Agency::  [facility]  Relationship::  Daughter  Contact Information:  [phone]  Housing/Transportation Living arrangements for the past 2 months:  Assisted Dealer of Information:  Medical Team, Adult Children, Facility Patient Interpreter Needed:  None Criminal Activity/Legal Involvement Pertinent to Current Situation/Hospitalization:  No - Comment as needed Significant Relationships:  Adult Children Lives with:  Facility Resident Do you feel safe going back to the place where you live?  Yes Need for family participation in patient care:  Yes (Comment)  Care giving concerns:  Patient is a resident at [facility].   Social Worker assessment / plan:  Received call back from patient's daughter. CSW introduced role and explained that discharge planning would be discussed. Daughter confirmed plan to return to [facility] today. He will go by [facility]. Notified daughter that oxygen will have to be delivered to the facility before he can return. Will call her when transport is set up. No further concerns. CSW encouraged patient's daughter to contact CSW as needed. CSW will continue to follow patient and his daughter and facilitate discharge back to ALF today.  Employment status:  Retired Health and safety inspector:  Medicare PT Recommendations:  Home with Home Health Information / Referral to community resources:  Other (Comment Required)(Plan is to return to ALF.)  Patient/Family's Response to care:  Patient only oriented to self. Patient's daughter agreeable to return to ALF today.  Patient's daughter supportive and involved in patient's care. Patient's daughter appreciated social work intervention.  Patient/Family's Understanding of and Emotional Response to Diagnosis, Current Treatment, and Prognosis:  Patient only oriented to self. Patient's daughter has a good understanding of the reason for admission and plan to return to ALF today. Patient's daughter appears happy with hospital care.  Emotional Assessment Appearance:  Appears stated age Attitude/Demeanor/Rapport:  Unable to Assess Affect (typically observed):  Unable to Assess Orientation:  Oriented to Self Alcohol / Substance use:  Never Used Psych involvement (Current and /or in the community):  No (Comment)  Discharge Needs  Concerns to be addressed:  Care Coordination Readmission within the last 30 days:  No Current discharge risk:  None Barriers to Discharge:  Other(Oxygen will need to be delivered to the facility before he can return.)   Margarito Liner, LCSW 09/17/2018, 11:49 AM

## 2018-09-21 ENCOUNTER — Ambulatory Visit (INDEPENDENT_AMBULATORY_CARE_PROVIDER_SITE_OTHER): Payer: Medicare Other | Admitting: Allergy & Immunology

## 2018-09-21 ENCOUNTER — Encounter: Payer: Self-pay | Admitting: Allergy & Immunology

## 2018-09-21 VITALS — BP 120/70 | HR 82 | Resp 22

## 2018-09-21 DIAGNOSIS — J310 Chronic rhinitis: Secondary | ICD-10-CM

## 2018-09-21 NOTE — Patient Instructions (Addendum)
1. Chronic rhinitis (indoor molds only) - Continue with Flonase (fluticasone) two sprays per nostril TWICE DAILY. - Continue with Astelin (azelastine) two sprays per nostril TWICE DAILY.  - Continue with Zyrtec (cetirizine) 10mg  and Singulair (montelukast) 10mg  DAILY.  - You need be seen by your Primary Care Provider or Cardiologist for your shortness of breath. - You might also need to be seen in the Emergency Room as well.   2. Return in about 6 months (around 03/22/2019).  Please inform us of any Emergency Department visits, hospitalizations, or changes in symptoms. Call us before going to the ED for breathing or allergy symptoms since we might be able to fit you in for a sick visit. Feel free to contact us anytime with any questions, problems, or concerns.  It was a pleasure to see you again today!  Websites that have reliable patient information: 1. American Academy of Asthma, Allergy, and Immunology: www.aaaai.org 2. Food Allergy Research and Education (FARE): foodallergy.org 3. Mothers of Asthmatics: http://www.asthmacommunitynetwork.org 4. American College of Allergy, Asthma, and Immunology: MissingWeapons.ca   Make sure you are registered to vote! If you have moved or changed any of your contact information, you will need to get this updated before voting!

## 2018-09-21 NOTE — Progress Notes (Signed)
FOLLOW UP  Date of Service/Encounter:  09/21/18   Assessment:   Chronic rhinitis (indoor molds)  History of interstitial fibrosis - needs pulmonologist  Congestive heart failure - with recent admission for respiratory failure  Stage III chronic kidney disease  Dementia  Chronic pain with neuropathy   Plan/Recommendations:   1. Chronic rhinitis (indoor molds only) - Continue with Flonase (fluticasone) two sprays per nostril TWICE DAILY. - Continue with Astelin (azelastine) two sprays per nostril TWICE DAILY.  - Continue with Zyrtec (cetirizine) 10mg  and Singulair (montelukast) 10mg  DAILY.  - You need be seen by your Primary Care Provider or Cardiologist for your shortness of breath. - You might also need to be seen in the Emergency Room as well.   2. Return in about 6 months (around 03/22/2019).  Subjective:   Christopher FreestoneJoseph Fenlon Sr. is a 85 y.o. male presenting today for follow up of  Chief Complaint  Patient presents with  . Allergies    Christopher FreestoneJoseph Bence Sr. has a history of the following: Patient Active Problem List   Diagnosis Date Noted  . Acute respiratory failure with hypoxia (HCC) 09/14/2018  . CHF exacerbation (HCC) 09/14/2018  . CKD (chronic kidney disease) stage 3, GFR 30-59 ml/min (HCC) 09/14/2018  . Dementia (HCC) 09/14/2018  . Chronic pain 09/14/2018  . Neuropathy 09/14/2018  . HTN (hypertension) 09/14/2018  . Overactive bladder 09/14/2018  . Depression 09/14/2018  . Anxiety 09/14/2018  . Physical deconditioning 09/14/2018  . Hypoxia 12/21/2017  . Chronic rhinitis 09/10/2017    History obtained from: chart review and patient.  Christopher FreestoneJoseph Zachow Sr.'s Primary Care Provider is Forrest Moronuehle, Stephen, MD.     Christopher Kelley is a 85 y.o. male presenting for a follow up visit.  Christopher Kelley was last seen in September 2019.  At that time, we continued his Flonase, Astelin, and Zyrtec as well as Singulair.  I emphasized the need to use these daily for the best effect.  He has a  history of testing that was positive to indoor mold only.   In the interim, he was admitted to the hospital in January for respiratory failure.  He was continued on supplemental oxygen.  He is followed by pulmonology for history of pulmonary fibrosis and pulmonary hypertension.  He was started on Coreg in the hospital and continued on aspirin.  It was felt that he had a mid/lower lung opacity secondary to atelectasis.  He was not put on antibiotics and did fine.  He also has acute kidney injury with stage III chronic kidney disease.  He was not felt to be a catheterization candidate. He was discharged on January 10th.   Since the last visit, he thinks that he has mostly done well.  His rhinitis continues to be a problem he does complain of stuffiness during the visit.  However, he tells us today that he is here for his breathing.  Review of his chart shows that he does have COPD, but this is always been managed by his primary care provider.  For his rhinitis, he thinks that he remains on his nasal sprays but he is not entirely sure.  He does have a history of interstitial fibrosis which has remained stable according to his chest CT from April 2019.  These interstitial changes were bilateral and most pronounced in the periphery of the lungs.  There was a small right upper lobe and left lower lobe granuloma present as well.  It is not clear that he sees a pulmonologist at this  time.  It is also unclear who his cardiologist is.  He is in the mid 80% range for his oxygen saturation on room air, and we do provide 2 L of oxygen in the clinic with improvement.  According to his discharge paperwork, he was supposed to be on 2 L of oxygen at baseline.  However, he clearly did not travel with it today.  Otherwise, there have been no changes to his past medical history, surgical history, family history, or social history.    Review of Systems: a 14-point review of systems is pertinent for what is mentioned in  HPI.  Otherwise, all other systems were negative.  Constitutional: negative other than that listed in the HPI Eyes: negative other than that listed in the HPI Ears, nose, mouth, throat, and face: negative other than that listed in the HPI Respiratory: negative other than that listed in the HPI Cardiovascular: negative other than that listed in the HPI Gastrointestinal: negative other than that listed in the HPI Genitourinary: negative other than that listed in the HPI Integument: negative other than that listed in the HPI Hematologic: negative other than that listed in the HPI Musculoskeletal: negative other than that listed in the HPI Neurological: negative other than that listed in the HPI Allergy/Immunologic: negative other than that listed in the HPI    Objective:   Blood pressure 120/70, pulse 82, resp. rate (!) 22, SpO2 (!) 85 %. There is no height or weight on file to calculate BMI.   Physical Exam:  General: Alert, interactive, in mild distress.  Eyes: No conjunctival injection bilaterally, no discharge on the right, no discharge on the left and no Horner-Trantas dots present. PERRL bilaterally. EOMI without pain. No photophobia.  Ears: Right TM pearly gray with normal light reflex, Left TM pearly gray with normal light reflex, Right TM intact without perforation and Left TM intact without perforation.  Nose/Throat: External nose within normal limits and septum midline. Turbinates edematous without discharge. Posterior oropharynx mildly erythematous without cobblestoning in the posterior oropharynx. Tonsils 2+ without exudates.  Tongue without thrush. Lungs: Clear to auscultation without wheezing or rales. No increased work of breathing. Rhonchi present throughout.  CV: Normal S1/S2. No murmurs. Capillary refill <2 seconds.  Skin: Warm and dry, without lesions or rashes. Neuro:   Grossly intact. Confused during the visit. Unfortunately, there is no interpreter present.    Diagnostic studies: none    Malachi Bonds, MD  [facility]

## 2018-09-27 ENCOUNTER — Telehealth: Payer: Self-pay

## 2018-09-27 NOTE — Telephone Encounter (Signed)
SENT REFERRAL TO SCHEDULING AND FILE NOTES 

## 2018-09-28 ENCOUNTER — Telehealth: Payer: Self-pay | Admitting: *Deleted

## 2018-09-28 NOTE — Telephone Encounter (Signed)
NOTES FAXED TO [facility] FROM [facility] [phone].

## 2018-10-05 ENCOUNTER — Encounter: Payer: Self-pay | Admitting: Pulmonary Disease

## 2018-10-05 ENCOUNTER — Ambulatory Visit (INDEPENDENT_AMBULATORY_CARE_PROVIDER_SITE_OTHER): Payer: Medicare Other | Admitting: Pulmonary Disease

## 2018-10-05 DIAGNOSIS — J9611 Chronic respiratory failure with hypoxia: Secondary | ICD-10-CM | POA: Diagnosis not present

## 2018-10-05 DIAGNOSIS — J84112 Idiopathic pulmonary fibrosis: Secondary | ICD-10-CM | POA: Diagnosis not present

## 2018-10-05 NOTE — Patient Instructions (Signed)
You have pulmonary fibrosis and your oxygen level runs low.  Use oxygen when you sleep

## 2018-10-05 NOTE — Assessment & Plan Note (Signed)
He does qualify for oxygen on ambulation.  Does not need this at rest at this time.  Probably needs it during sleep. I am not sure if giving him portable oxygen will impose a severe care burden on him and hence will defer this to PCP and his caregivers at the nursing home. I do not have his immunization records but presume that they are up-to-date since he is in a nursing home setting.  We will see him only on an as-needed basis

## 2018-10-05 NOTE — Progress Notes (Signed)
Subjective:    Patient ID: Christopher Freestone Sr., male    DOB: 09-20-1928, 85 y.o.   MRN: [id number]  HPI   Chief Complaint  Patient presents with  . Pulm Consult    Referred by [facility] for SOB. Per patient    85 year old with dementia, nursing home resident presents for evaluation of hypoxia noted during recent hospital admission. Patient has dementia and there is no one accompanying him today and he is not a reliable historian.  History is obtained after reviewing his chart and nursing home records. He has been residing at [facility] for at least the past 5 years since his wife died He ambulates with a cane and is able to perform activities of daily living but his memory is failing.  He has heart failure with reduced EF, 30 to 35% estimated on recent echo 09/2018 and permanent pacemaker He was hospitalized 1/7 to 1/10 for shortness of breath and hypoxia. Chest x-ray showed interstitial infiltrates it was unclear whether there was a left lower lobe infiltrate.  BNP was normal.  He had no leukocytosis. He was noted to have bibasilar crackles.  He was diuresed but creatinine increased to 54/1.5 and diuresis was discontinued.  He was DNR.  I have reviewed his prior films. CT chest angiogram 04/2016 showed interstitial fibrosis with UIP pattern and honeycombing. CT angiogram 12/2017 showed again interstitial fibrosis with bilateral apical bullous changes.  He reports smoking only as a teenager and then he quit.  He works in [employer] and as a Production designer, theatre/television/film man at [employer]  Nursing home records show that he is on albuterol nebs and also on 4 L of oxygen continuously.  Oxygen saturation was 91% on room air today and on walking even a few steps he desaturated to 84%  Past Medical History:  Diagnosis Date  . Acid reflux   . Anxiety   . AV block   . BPH (benign prostatic hyperplasia)   . CHF (congestive heart failure) (HCC)   . Depression   . High cholesterol   . Hypertension   .  Hypoxia   . Pacemaker     Past Surgical History:  Procedure Laterality Date  . CHOLECYSTECTOMY      Allergies  Allergen Reactions  . Amoxicillin Hives  . Doxycycline Rash    Social History   Socioeconomic History  . Marital status: Widowed    Spouse name: Not on file  . Number of children: Not on file  . Years of education: Not on file  . Highest education level: Not on file  Occupational History  . Not on file  Social Needs  . Financial resource strain: Not on file  . Food insecurity:    Worry: Not on file    Inability: Not on file  . Transportation needs:    Medical: Not on file    Non-medical: Not on file  Tobacco Use  . Smoking status: Former Games developer  . Smokeless tobacco: Never Used  Substance and Sexual Activity  . Alcohol use: No  . Drug use: No  . Sexual activity: Not on file  Lifestyle  . Physical activity:    Days per week: Not on file    Minutes per session: Not on file  . Stress: Not on file  Relationships  . Social connections:    Talks on phone: Not on file    Gets together: Not on file    Attends religious service: Not on file    Active  member of club or organization: Not on file    Attends meetings of clubs or organizations: Not on file    Relationship status: Not on file  . Intimate partner violence:    Fear of current or ex partner: Not on file    Emotionally abused: Not on file    Physically abused: Not on file    Forced sexual activity: Not on file  Other Topics Concern  . Not on file  Social History Narrative  . Not on file     History reviewed. No pertinent family history.    Review of Systems  Constitutional: Negative for fever and unexpected weight change.  HENT: Negative for congestion, dental problem, ear pain, nosebleeds, postnasal drip, rhinorrhea, sinus pressure, sneezing, sore throat and trouble swallowing.   Eyes: Negative for redness and itching.  Respiratory: Positive for shortness of breath. Negative for cough,  chest tightness and wheezing.   Cardiovascular: Negative for palpitations and leg swelling.  Gastrointestinal: Negative for nausea and vomiting.  Genitourinary: Negative for dysuria.  Musculoskeletal: Negative for joint swelling.  Skin: Negative for rash.  Allergic/Immunologic: Negative.  Negative for environmental allergies, food allergies and immunocompromised state.  Neurological: Negative for headaches.  Hematological: Does not bruise/bleed easily.  Psychiatric/Behavioral: Negative for dysphoric mood. The patient is not nervous/anxious.        Objective:   Physical Exam   Gen. Pleasant, thin, elderly,, in no distress, normal affect ENT - no pallor,icterus, no post nasal drip Neck: No JVD, no thyromegaly, no carotid bruits Lungs: no use of accessory muscles, no dullness to percussion, bibasal dry rales no rhonchi  Cardiovascular: Rhythm regular, heart sounds  normal, no murmurs or gallops, no peripheral edema Abdomen: soft and non-tender, no hepatosplenomegaly, BS normal. Musculoskeletal: No deformities, no cyanosis or clubbing Neuro:  alert, non focal        Assessment & Plan:

## 2018-10-05 NOTE — Assessment & Plan Note (Signed)
Based on prior CT which shows honeycombing, predominantly basal and peripheral pattern, this is more suggestive UIP and therefore idiopathic pulmonary fibrosis. There seems to have been mild disease progression.  Given his dementia and other comorbidities, he is not a candidate for anti-fibrotic therapies. There was no evidence of fluid overload either by BNP or clinically and that this does not seem to be heart failure.  There was no leukocytosis and low procalcitonin so I do not feel that he had a pneumonia.  He denies sputum production or fevers.

## 2018-10-26 ENCOUNTER — Ambulatory Visit: Payer: Medicare Other | Admitting: Cardiovascular Disease

## 2019-01-14 ENCOUNTER — Telehealth: Payer: Self-pay | Admitting: Radiation Oncology

## 2019-01-14 NOTE — Telephone Encounter (Signed)
New message:      Left a message with lady at nursing facility who states the patients nurse was not in and she will give her a message to give me a call back on Monday to set up appt for patient who is in a nursing facility.

## 2019-01-17 ENCOUNTER — Telehealth: Payer: Self-pay | Admitting: Radiation Oncology

## 2019-01-17 NOTE — Telephone Encounter (Signed)
New message:     LVM with patient's daughter to return call the schedule appt with Dr. Basilio Cairo per referral received

## 2019-01-17 NOTE — Telephone Encounter (Signed)
New message:      Per returned phone call from patient's daughter. Patient is refusing treatment at this time and no appt will be set up at this time.

## 2019-06-22 ENCOUNTER — Emergency Department (HOSPITAL_COMMUNITY)
Admission: EM | Admit: 2019-06-22 | Discharge: 2019-06-22 | Disposition: A | Discharge status: Home / Self Care | Payer: Medicare Other | Attending: Emergency Medicine | Admitting: Emergency Medicine

## 2019-06-22 ENCOUNTER — Emergency Department (HOSPITAL_COMMUNITY): Payer: Medicare Other

## 2019-06-22 ENCOUNTER — Other Ambulatory Visit: Payer: Self-pay

## 2019-06-22 ENCOUNTER — Encounter (HOSPITAL_COMMUNITY): Payer: Self-pay | Admitting: Emergency Medicine

## 2019-06-22 DIAGNOSIS — Z79899 Other long term (current) drug therapy: Secondary | ICD-10-CM | POA: Diagnosis not present

## 2019-06-22 DIAGNOSIS — Z7982 Long term (current) use of aspirin: Secondary | ICD-10-CM | POA: Diagnosis not present

## 2019-06-22 DIAGNOSIS — I130 Hypertensive heart and chronic kidney disease with heart failure and stage 1 through stage 4 chronic kidney disease, or unspecified chronic kidney disease: Secondary | ICD-10-CM | POA: Insufficient documentation

## 2019-06-22 DIAGNOSIS — N183 Chronic kidney disease, stage 3 unspecified: Secondary | ICD-10-CM | POA: Diagnosis not present

## 2019-06-22 DIAGNOSIS — R4182 Altered mental status, unspecified: Secondary | ICD-10-CM | POA: Diagnosis not present

## 2019-06-22 DIAGNOSIS — F0390 Unspecified dementia without behavioral disturbance: Secondary | ICD-10-CM | POA: Diagnosis not present

## 2019-06-22 DIAGNOSIS — I509 Heart failure, unspecified: Secondary | ICD-10-CM | POA: Insufficient documentation

## 2019-06-22 DIAGNOSIS — Z87891 Personal history of nicotine dependence: Secondary | ICD-10-CM | POA: Diagnosis not present

## 2019-06-22 LAB — BLOOD GAS, VENOUS
Acid-Base Excess: 2.1 mmol/L — ABNORMAL HIGH (ref 0.0–2.0)
Bicarbonate: 29.8 mmol/L — ABNORMAL HIGH (ref 20.0–28.0)
O2 Saturation: 43.6 %
Patient temperature: 98.6
pCO2, Ven: 60.2 mmHg — ABNORMAL HIGH (ref 44.0–60.0)
pH, Ven: 7.315 (ref 7.250–7.430)

## 2019-06-22 LAB — CBC WITH DIFFERENTIAL/PLATELET
Abs Immature Granulocytes: 0.04 10*3/uL (ref 0.00–0.07)
Basophils Absolute: 0.1 10*3/uL (ref 0.0–0.1)
Basophils Relative: 1 %
Eosinophils Absolute: 0.3 10*3/uL (ref 0.0–0.5)
Eosinophils Relative: 3 %
HCT: 50.2 % (ref 39.0–52.0)
Hemoglobin: 15.9 g/dL (ref 13.0–17.0)
Immature Granulocytes: 1 %
Lymphocytes Relative: 18 %
Lymphs Abs: 1.5 10*3/uL (ref 0.7–4.0)
MCH: 30.8 pg (ref 26.0–34.0)
MCHC: 31.7 g/dL (ref 30.0–36.0)
MCV: 97.3 fL (ref 80.0–100.0)
Monocytes Absolute: 0.6 10*3/uL (ref 0.1–1.0)
Monocytes Relative: 8 %
Neutro Abs: 6.1 10*3/uL (ref 1.7–7.7)
Neutrophils Relative %: 69 %
Platelets: 186 10*3/uL (ref 150–400)
RBC: 5.16 MIL/uL (ref 4.22–5.81)
RDW: 14.7 % (ref 11.5–15.5)
WBC: 8.6 10*3/uL (ref 4.0–10.5)
nRBC: 0 % (ref 0.0–0.2)

## 2019-06-22 LAB — URINALYSIS, ROUTINE W REFLEX MICROSCOPIC
Bilirubin Urine: NEGATIVE
Glucose, UA: NEGATIVE mg/dL
Hgb urine dipstick: NEGATIVE
Ketones, ur: NEGATIVE mg/dL
Leukocytes,Ua: NEGATIVE
Nitrite: NEGATIVE
Protein, ur: NEGATIVE mg/dL
Specific Gravity, Urine: 1.029 (ref 1.005–1.030)
pH: 5 (ref 5.0–8.0)

## 2019-06-22 LAB — COMPREHENSIVE METABOLIC PANEL
ALT: 15 U/L (ref 0–44)
AST: 25 U/L (ref 15–41)
Albumin: 3.7 g/dL (ref 3.5–5.0)
Alkaline Phosphatase: 56 U/L (ref 38–126)
Anion gap: 9 (ref 5–15)
BUN: 33 mg/dL — ABNORMAL HIGH (ref 8–23)
CO2: 24 mmol/L (ref 22–32)
Calcium: 8.9 mg/dL (ref 8.9–10.3)
Chloride: 107 mmol/L (ref 98–111)
Creatinine, Ser: 1.03 mg/dL (ref 0.61–1.24)
GFR calc Af Amer: >60 mL/min (ref >60)
GFR calc non Af Amer: >60 mL/min (ref >60)
Glucose, Bld: 86 mg/dL (ref 70–99)
Potassium: 4.5 mmol/L (ref 3.5–5.1)
Sodium: 140 mmol/L (ref 135–145)
Total Bilirubin: 0.5 mg/dL (ref 0.3–1.2)
Total Protein: 6.5 g/dL (ref 6.5–8.1)

## 2019-06-22 LAB — TROPONIN I (HIGH SENSITIVITY)
Troponin I (High Sensitivity): 7 ng/L (ref <18)
Troponin I (High Sensitivity): 5 ng/L (ref <18)

## 2019-06-22 LAB — CBG MONITORING, ED: Glucose-Capillary: 84 mg/dL (ref 70–99)

## 2019-06-22 LAB — AMMONIA: Ammonia: 40 umol/L — ABNORMAL HIGH (ref 9–35)

## 2019-06-22 NOTE — ED Notes (Signed)
[facility] called and paperwork printed. IV removed

## 2019-06-22 NOTE — ED Notes (Signed)
Date and time results received: 06/22/19 3:46 PM  (use smartphrase ".now" to insert current time)  Test: Venous Blood Gas Critical Value: pO2 below reportable range  Name of Provider Notified: Regenia Skeeter  Orders Received? Or Actions Taken?: awaiting orders

## 2019-06-22 NOTE — ED Triage Notes (Signed)
Patient arrived by EMS from [facility].   EMS stated patient has had increasing confusion x 2 days. Pt was found wandering the halls of facility per EMS.  Facility stated oxygen was empty in the oxygen tank. Patient was put back on baseline oxygen (4L [place]). Patient began acting more towards baseline again.   Facility still wanted patient to be seen by EDP per EMS.   Patient has Dementia. Patient baseline is alert and oriented x 1 (only to self) per EMS.

## 2019-06-22 NOTE — ED Provider Notes (Signed)
[facility]-EMERGENCY DEPT Provider Note   CSN: [id number] Arrival date & time: 06/22/19  1333    LEVEL 5 CAVEAT - DEMENTIA   History   Chief Complaint Chief Complaint  Patient presents with   Altered Mental Status    HPI Christopher Kelley Sr. is a 85 y.o. male.     HPI  85 year old male presents from [facility] for confusion.  Apparently he has been confused for a couple days according to EMS.  Was found wandering the halls in the facility states that his oxygen tank was empty.  He was put back on his typical 4 L we now seems to be acting a little more towards his baseline.  Normally A&O self only. Patient states he's aching in his legs, and that this is a chronic problem. He denies any chest pain or dyspnea.   His caretaker at the nursing home states that he was walking down the hall without his oxygen on and his sats were in the 70s. Had cyanosis of the hands. He's been confused since yesterday or the day before, saying off the wall things like thanking people for buying him this house. Staff thinks the mental status has improved some since the oxygen was placed back on. No fevers or cough.  Past Medical History:  Diagnosis Date   Acid reflux    Alcoholism (HCC)    Anxiety    AV block    BPH (benign prostatic hyperplasia)    CHF (congestive heart failure) (HCC)    Depression    High cholesterol    Hypertension    Hypoxia    Pacemaker     Patient Active Problem List   Diagnosis Date Noted   IPF (idiopathic pulmonary fibrosis) (HCC) 10/05/2018   CHF exacerbation (HCC) 09/14/2018   CKD (chronic kidney disease) stage 3, GFR 30-59 ml/min 09/14/2018   Dementia (HCC) 09/14/2018   Chronic pain 09/14/2018   Neuropathy 09/14/2018   HTN (hypertension) 09/14/2018   Overactive bladder 09/14/2018   Depression 09/14/2018   Anxiety 09/14/2018   Physical deconditioning 09/14/2018   Chronic respiratory failure with hypoxia (HCC)  12/21/2017   Chronic rhinitis 09/10/2017    Past Surgical History:  Procedure Laterality Date   CATARACT EXTRACTION, BILATERAL     CHOLECYSTECTOMY     INSERT / REPLACE / REMOVE PACEMAKER          Home Medications    Prior to Admission medications   Medication Sig Start Date End Date Taking? Authorizing Provider  aspirin EC 81 MG tablet Take 81 mg by mouth daily.   Yes [provider]  carvedilol (COREG) 3.125 MG tablet Take 3.125 mg by mouth 2 (two) times daily with a meal.   Yes [provider]  cetirizine (ZYRTEC) 10 MG tablet Take 1 tablet (10 mg total) by mouth daily. 09/10/17  Yes Alfonse Spruce, MD  Cranberry 450 MG TABS Take 450 mg by mouth daily.   Yes [provider]  donepezil (ARICEPT) 10 MG tablet Take 10 mg by mouth at bedtime.   Yes [provider]  famotidine (PEPCID) 20 MG tablet Take 20 mg by mouth at bedtime.   Yes [provider]  fluticasone (FLONASE) 50 MCG/ACT nasal spray Place 2 sprays into both nostrils daily. 09/10/17  Yes Alfonse Spruce, MD  gabapentin (NEURONTIN) 300 MG capsule Take 300 mg by mouth 3 (three) times daily.   Yes [provider]  HYDROcodone-acetaminophen (NORCO/VICODIN) 5-325 MG tablet Take 1  tablet by mouth every 6 (six) hours as needed (for pain). 09/17/18  Yes Tyrone NineGrunz, Ryan B, MD  hydroxypropyl methylcellulose / hypromellose (ISOPTO TEARS / GONIOVISC) 2.5 % ophthalmic solution Place 2 drops into both eyes daily.   Yes [provider]  ipratropium (ATROVENT) 0.03 % nasal spray Place 2 sprays into both nostrils every 6 (six) hours as needed for rhinitis. Patient taking differently: Place 2 sprays into both nostrils every 6 (six) hours as needed (for allergies).  09/10/17  Yes Alfonse SpruceGallagher, Joel Louis, MD  LORazepam (ATIVAN) 1 MG tablet Take 1 tablet (1 mg total) by mouth 3 (three) times daily. 09/17/18  Yes Tyrone NineGrunz, Ryan B, MD  magnesium hydroxide (MILK OF MAGNESIA) 400 MG/5ML  suspension Take 30 mLs by mouth daily as needed for mild constipation.   Yes [provider]  Melatonin 3 MG TABS Take 3 mg by mouth at bedtime.    Yes [provider]  mirtazapine (REMERON) 30 MG tablet Take 30 mg by mouth at bedtime.   Yes [provider]  montelukast (SINGULAIR) 10 MG tablet Take 1 tablet (10 mg total) by mouth at bedtime. 09/10/17  Yes Alfonse SpruceGallagher, Joel Louis, MD  Multiple Vitamin (TAB-A-VITE) TABS Take 1 tablet by mouth daily.   Yes [provider]  MYRBETRIQ 25 MG TB24 tablet Take 25 mg by mouth daily. 12/18/17  Yes [provider]  PARoxetine (PAXIL) 30 MG tablet Take 30 mg by mouth daily.    Yes [provider]  polyethylene glycol (MIRALAX / GLYCOLAX) packet Take 17 g by mouth daily.    Yes [provider]  QUEtiapine (SEROQUEL) 25 MG tablet Take 25 mg by mouth 2 (two) times daily.    Yes [provider]  Skin Protectants, Misc. (EUCERIN) cream Apply 1 application topically See admin instructions. Apply behind ears topically every 12 hours as needed for dry skin   Yes [provider]  traMADol (ULTRAM) 50 MG tablet Take 1 tablet (50 mg total) by mouth 3 (three) times daily. 09/17/18  Yes Tyrone NineGrunz, Ryan B, MD  traZODone (DESYREL) 50 MG tablet Take 50 mg by mouth at bedtime.   Yes [provider]    Family History History reviewed. No pertinent family history.  Social History Social History   Tobacco Use   Smoking status: Former Smoker   Smokeless tobacco: Never Used  Substance Use Topics   Alcohol use: No   Drug use: No     Allergies   Amoxicillin and Doxycycline   Review of Systems Review of Systems  Unable to perform ROS: Dementia     Physical Exam Updated Vital Signs BP 116/64    Pulse 87    Temp 98.4 F (36.9 C) (Oral)    Resp 18    Wt 82.4 kg    SpO2 97%    BMI 27.62 kg/m   Physical Exam Vitals signs and nursing note reviewed.  Constitutional:       Appearance: He is well-developed.  HENT:     Head: Normocephalic and atraumatic.     Right Ear: External ear normal.     Left Ear: External ear normal.     Nose: Nose normal.  Eyes:     General:        Right eye: No discharge.        Left eye: No discharge.  Neck:     Musculoskeletal: Neck supple.  Cardiovascular:     Rate and Rhythm: Normal rate and regular rhythm.  Heart sounds: Normal heart sounds.  Pulmonary:     Effort: Pulmonary effort is normal.     Breath sounds: Normal breath sounds. No wheezing.  Abdominal:     Palpations: Abdomen is soft.     Tenderness: There is no abdominal tenderness.  Musculoskeletal:     Comments: No focal tenderness in large muscle groups  Skin:    General: Skin is warm and dry.  Neurological:     Mental Status: He is alert. He is disoriented.     Comments: Alert, oriented to self only. CN 3-12 grossly intact. 5/5 strength in all 4 extremities. Grossly normal sensation.  Psychiatric:        Mood and Affect: Mood is not anxious.      ED Treatments / Results  Labs (all labs ordered are listed, but only abnormal results are displayed) Labs Reviewed  COMPREHENSIVE METABOLIC PANEL - Abnormal; Notable for the following components:      Result Value   BUN 33 (*)    All other components within normal limits  BLOOD GAS, VENOUS - Abnormal; Notable for the following components:   pCO2, Ven 60.2 (*)    Bicarbonate 29.8 (*)    Acid-Base Excess 2.1 (*)    All other components within normal limits  AMMONIA - Abnormal; Notable for the following components:   Ammonia 40 (*)    All other components within normal limits  CBC WITH DIFFERENTIAL/PLATELET  URINALYSIS, ROUTINE W REFLEX MICROSCOPIC  CBG MONITORING, ED  TROPONIN I (HIGH SENSITIVITY)  TROPONIN I (HIGH SENSITIVITY)    EKG EKG Interpretation  Date/Time:  Wednesday June 22 2019 18:44:41 EDT Ventricular Rate:  79 PR Interval:    QRS Duration: 113 QT Interval:  386 QTC  Calculation: 443 R Axis:   -118 Text Interpretation:  Sinus rhythm Inferior infarct, old Consider anterolateral infarct Nonspecific T wave abnormality Confirmed by Sherwood Gambler [id number]) on 06/22/2019 7:18:15 PM   Radiology Ct Head Wo Contrast  Result Date: 06/22/2019 CLINICAL DATA:  Altered level of consciousness, increased confusion over 2 days EXAM: CT HEAD WITHOUT CONTRAST TECHNIQUE: Contiguous axial images were obtained from the base of the skull through the vertex without intravenous contrast. COMPARISON:  Head CT dated 03/01/2017 FINDINGS: Brain: No evidence of acute infarction, hemorrhage, hydrocephalus, extra-axial collection or mass lesion/mass effect. There is mild cerebral volume loss with associated ex vacuo dilatation. Vascular: There are vascular calcifications in the carotid siphons. Skull: Normal. Negative for fracture or focal lesion. Sinuses/Orbits: No acute finding. Other: None. IMPRESSION: No acute intracranial process. Electronically Signed   By: Zerita Boers M.D.   On: 06/22/2019 18:57   Dg Chest Portable 1 View  Result Date: 06/22/2019 CLINICAL DATA:  85 year old male with history of worsening confusion for the past 2 days. EXAM: PORTABLE CHEST 1 VIEW COMPARISON:  Chest x-ray 09/14/2018. FINDINGS: Lung volumes are very low with widespread interstitial opacities throughout the lungs bilaterally, most severe in the lung bases, compatible with underlying interstitial lung disease. This appears relatively similar to prior studies. The possibility of superimposed airspace consolidation is not excluded, but not strongly favored. No definite pleural effusions. Pulmonary vasculature is not engorged. Heart size is within normal limits. Upper mediastinal contours are within normal limits. Aortic atherosclerosis. Left-sided biventricular pacemaker device again noted. IMPRESSION: 1. Low lung volumes with chronic changes of interstitial lung disease. This is poorly evaluated on today's  plain film evaluation, and could be much better evaluated and characterized with follow-up nonemergent high-resolution chest CT. 2.  Aortic atherosclerosis. Electronically Signed   By: Trudie Reed M.D.   On: 06/22/2019 16:01    Procedures Procedures (including critical care time)  Medications Ordered in ED Medications - No data to display   Initial Impression / Assessment and Plan / ED Course  I have reviewed the triage vital signs and the nursing notes.  Pertinent labs & imaging results that were available during my care of the patient were reviewed by me and considered in my medical decision making (see chart for details).        Patient presents for episode of altered mental status.  He seems to be closer to his baseline.  Probably related to walking around with significant hypoxia in the 70% range.  Here his O2 sats are in the mid to high 90s and he appears comfortable at rest.  No acute complaints.  He was noted to have some nonspecific T waves though no complaints.  Probably related to the significant hypoxia and possible effects on his heart.  However there is no evidence of damage such as MI with troponins negative x2.  Given this, I think he is stable to go back to his facility and follow-up closely with his PCP.  There is no signs of an acute infection.  Minimally elevated ammonia but he does not appear to clinically have hepatic encephalopathy.  His venous blood gas shows a pH over 7.3 with compensated/chronic respiratory acidosis.  Return precautions given.  Final Clinical Impressions(s) / ED Diagnoses   Final diagnoses:  Altered mental status, unspecified altered mental status type    ED Discharge Orders    None       Pricilla Loveless, MD 06/22/19 1919

## 2019-07-31 ENCOUNTER — Emergency Department (HOSPITAL_COMMUNITY): Payer: Medicare Other

## 2019-07-31 ENCOUNTER — Emergency Department (HOSPITAL_COMMUNITY)
Admission: EM | Admit: 2019-07-31 | Discharge: 2019-07-31 | Disposition: A | Discharge status: Skilled Nursing Facility | Payer: Medicare Other | Attending: Emergency Medicine | Admitting: Emergency Medicine

## 2019-07-31 ENCOUNTER — Other Ambulatory Visit: Payer: Self-pay

## 2019-07-31 DIAGNOSIS — Y92128 Other place in nursing home as the place of occurrence of the external cause: Secondary | ICD-10-CM | POA: Diagnosis not present

## 2019-07-31 DIAGNOSIS — Y999 Unspecified external cause status: Secondary | ICD-10-CM | POA: Insufficient documentation

## 2019-07-31 DIAGNOSIS — Z87891 Personal history of nicotine dependence: Secondary | ICD-10-CM | POA: Diagnosis not present

## 2019-07-31 DIAGNOSIS — S60222A Contusion of left hand, initial encounter: Secondary | ICD-10-CM | POA: Insufficient documentation

## 2019-07-31 DIAGNOSIS — Y9389 Activity, other specified: Secondary | ICD-10-CM | POA: Insufficient documentation

## 2019-07-31 DIAGNOSIS — Z79899 Other long term (current) drug therapy: Secondary | ICD-10-CM | POA: Diagnosis not present

## 2019-07-31 DIAGNOSIS — Z7982 Long term (current) use of aspirin: Secondary | ICD-10-CM | POA: Insufficient documentation

## 2019-07-31 DIAGNOSIS — N183 Chronic kidney disease, stage 3 unspecified: Secondary | ICD-10-CM | POA: Diagnosis not present

## 2019-07-31 DIAGNOSIS — F0390 Unspecified dementia without behavioral disturbance: Secondary | ICD-10-CM | POA: Diagnosis not present

## 2019-07-31 DIAGNOSIS — I130 Hypertensive heart and chronic kidney disease with heart failure and stage 1 through stage 4 chronic kidney disease, or unspecified chronic kidney disease: Secondary | ICD-10-CM | POA: Insufficient documentation

## 2019-07-31 DIAGNOSIS — S0083XA Contusion of other part of head, initial encounter: Secondary | ICD-10-CM | POA: Insufficient documentation

## 2019-07-31 DIAGNOSIS — I509 Heart failure, unspecified: Secondary | ICD-10-CM | POA: Insufficient documentation

## 2019-07-31 DIAGNOSIS — W19XXXA Unspecified fall, initial encounter: Secondary | ICD-10-CM

## 2019-07-31 DIAGNOSIS — W01198A Fall on same level from slipping, tripping and stumbling with subsequent striking against other object, initial encounter: Secondary | ICD-10-CM | POA: Diagnosis not present

## 2019-07-31 DIAGNOSIS — S0990XA Unspecified injury of head, initial encounter: Secondary | ICD-10-CM | POA: Diagnosis present

## 2019-07-31 MED ORDER — BACITRACIN ZINC 500 UNIT/GM EX OINT
TOPICAL_OINTMENT | Freq: Once | CUTANEOUS | Status: AC
  Administered 2019-07-31: 1 via TOPICAL

## 2019-07-31 NOTE — Discharge Instructions (Addendum)
You have been seen today after fall. Please read and follow all provided instructions. Return to the emergency room for worsening condition or new concerning symptoms including change in mental status.  CT scan of his head, neck and face are negative for any signs of injury. X-ray of his left wrist does not show any broken bones. Bacitracin ointment and a dressing was applied to his left wrist.  Please continue to clean this daily and watch for signs of infection. no sutures were placed today.  1. Medications:  Continue usual home medications Take medications as prescribed. Please review all of the medicines and only take them if you do not have an allergy to them.   2. Treatment: rest, drink plenty of fluids  3. Follow Up: Please follow up with your primary doctor in 2-5 days for discussion of your diagnoses and further evaluation after today's visit; Call today to arrange your follow up.    ?

## 2019-07-31 NOTE — ED Provider Notes (Signed)
Medical screening examination/treatment/procedure(s) were conducted as a shared visit with non-physician practitioner(s) and myself.  I personally evaluated the patient during the encounter.      Patient followed by me along with the physician assistant.  Patient brought in from [facility].  He had a mechanical fall this morning.  Bruising was noted to his left cheek and skin tears to the left hand.  Staff said no loss of consciousness.  Patient not on blood thinners.  Patient denies any complaints.  EMS and facility claimed that patient's mental status was at baseline.  He does have a history of dementia.  He is a DNR.  On physical exam patient with a left zygoma area with an abrasion and bruising.  Has small skin tears to his left wrist and left hand with a lot of bruising of that area.  Patient without any specific complaints.  He moves his lower extremities well.  Good motion of the left hand.  Radial pulses 2+.  Cap refill is less than 2 seconds.  X-rays of the left wrist without any acute bony abnormalities.  CTs of head neck and face without any acute abnormalities.  Patient stable for discharge back to nursing facility.  Results for orders placed or performed during the hospital encounter of 06/22/19  Comprehensive metabolic panel  Result Value Ref Range   Sodium 140 135 - 145 mmol/L   Potassium 4.5 3.5 - 5.1 mmol/L   Chloride 107 98 - 111 mmol/L   CO2 24 22 - 32 mmol/L   Glucose, Bld 86 70 - 99 mg/dL   BUN 33 (H) 8 - 23 mg/dL   Creatinine, Ser 1.611.03 0.61 - 1.24 mg/dL   Calcium 8.9 8.9 - 09.610.3 mg/dL   Total Protein 6.5 6.5 - 8.1 g/dL   Albumin 3.7 3.5 - 5.0 g/dL   AST 25 15 - 41 U/L   ALT 15 0 - 44 U/L   Alkaline Phosphatase 56 38 - 126 U/L   Total Bilirubin 0.5 0.3 - 1.2 mg/dL   GFR calc non Af Amer >60 >60 mL/min   GFR calc Af Amer >60 >60 mL/min   Anion gap 9 5 - 15  CBC with Differential  Result Value Ref Range   WBC 8.6 4.0 - 10.5 K/uL   RBC 5.16 4.22 - 5.81  MIL/uL   Hemoglobin 15.9 13.0 - 17.0 g/dL   HCT 04.550.2 40.939.0 - 81.152.0 %   MCV 97.3 80.0 - 100.0 fL   MCH 30.8 26.0 - 34.0 pg   MCHC 31.7 30.0 - 36.0 g/dL   RDW 91.414.7 78.211.5 - 95.615.5 %   Platelets 186 150 - 400 K/uL   nRBC 0.0 0.0 - 0.2 %   Neutrophils Relative % 69 %   Neutro Abs 6.1 1.7 - 7.7 K/uL   Lymphocytes Relative 18 %   Lymphs Abs 1.5 0.7 - 4.0 K/uL   Monocytes Relative 8 %   Monocytes Absolute 0.6 0.1 - 1.0 K/uL   Eosinophils Relative 3 %   Eosinophils Absolute 0.3 0.0 - 0.5 K/uL   Basophils Relative 1 %   Basophils Absolute 0.1 0.0 - 0.1 K/uL   Immature Granulocytes 1 %   Abs Immature Granulocytes 0.04 0.00 - 0.07 K/uL  Urinalysis, Routine w reflex microscopic  Result Value Ref Range   Color, Urine YELLOW YELLOW   APPearance CLEAR CLEAR   Specific Gravity, Urine 1.029 1.005 - 1.030   pH 5.0 5.0 - 8.0   Glucose, UA  NEGATIVE NEGATIVE mg/dL   Hgb urine dipstick NEGATIVE NEGATIVE   Bilirubin Urine NEGATIVE NEGATIVE   Ketones, ur NEGATIVE NEGATIVE mg/dL   Protein, ur NEGATIVE NEGATIVE mg/dL   Nitrite NEGATIVE NEGATIVE   Leukocytes,Ua NEGATIVE NEGATIVE  Blood gas, venous  Result Value Ref Range   pH, Ven 7.315 7.250 - 7.430   pCO2, Ven 60.2 (H) 44.0 - 60.0 mmHg   pO2, Ven RESULT BELOW REPORTABLE RANGE 32.0 - 45.0 mmHg   Bicarbonate 29.8 (H) 20.0 - 28.0 mmol/L   Acid-Base Excess 2.1 (H) 0.0 - 2.0 mmol/L   O2 Saturation 43.6 %   Patient temperature 98.6   Ammonia  Result Value Ref Range   Ammonia 40 (H) 9 - 35 umol/L  CBG monitoring, ED  Result Value Ref Range   Glucose-Capillary 84 70 - 99 mg/dL  Troponin I (High Sensitivity)  Result Value Ref Range   Troponin I (High Sensitivity) 7 <18 ng/L  Troponin I (High Sensitivity)  Result Value Ref Range   Troponin I (High Sensitivity) 5 <18 ng/L   Dg Wrist Complete Left  Result Date: 07/31/2019 CLINICAL DATA:  Status post fall. EXAM: LEFT WRIST - COMPLETE 3+ VIEW COMPARISON:  None. FINDINGS: No evidence of fracture. No  subluxation or dislocation. Degenerative changes are noted in the carpus and first carpometacarpal joint. IMPRESSION: Degenerative changes without acute bony abnormality. Electronically Signed   By: Misty Stanley M.D.   On: 07/31/2019 11:51   Ct Head Wo Contrast  Result Date: 07/31/2019 CLINICAL DATA:  Fall onto face. Left cheek bruising EXAM: CT HEAD WITHOUT CONTRAST CT MAXILLOFACIAL WITHOUT CONTRAST CT CERVICAL SPINE WITHOUT CONTRAST TECHNIQUE: Multidetector CT imaging of the head, cervical spine, and maxillofacial structures were performed using the standard protocol without intravenous contrast. Multiplanar CT image reconstructions of the cervical spine and maxillofacial structures were also generated. COMPARISON:  03/01/2017, 11/27/2016 FINDINGS: CT HEAD FINDINGS Brain: No evidence of acute infarction, hemorrhage, hydrocephalus, extra-axial collection or mass lesion/mass effect. Extensive low-density changes within the periventricular and subcortical white matter compatible with chronic microvascular ischemic change. Moderate diffuse cerebral volume loss. Vascular: No hyperdense vessel or unexpected calcification. Skull: Normal. Negative for fracture or focal lesion. Other: None. CT MAXILLOFACIAL FINDINGS Osseous: No fracture or mandibular dislocation. No destructive process. Orbits: Negative. No traumatic or inflammatory finding. Sinuses: Clear. Soft tissues: Small soft tissue hematoma just inferior to the left zygomatic process. CT CERVICAL SPINE FINDINGS Alignment: Slightly exaggerated cervical lordosis. No static listhesis. Facet joints are aligned without dislocation. Dens and lateral masses are aligned. Skull base and vertebrae: No acute fracture. No primary bone lesion or focal pathologic process. Soft tissues and spinal canal: No prevertebral fluid or swelling. No visible canal hematoma. Disc levels: Relative preservation of the intervertebral disc heights. Mild multilevel facet and uncovertebral  arthropathy is similar to the prior exam resulting in varying degrees of bilateral foraminal narrowing most pronounced at C5-6 on the left. No evidence of high-grade canal stenosis. Upper chest: Severe emphysematous changes and scarring within the bilateral lung apices. Other: None. IMPRESSION: 1. No acute intracranial abnormality. Atrophy with chronic small vessel ischemic disease. 2. Small soft tissue hematoma just inferior to the left zygomatic process. 3. No acute maxillofacial fracture. 4. Cervical spondylosis without acute osseous abnormality. Electronically Signed   By: Davina Poke M.D.   On: 07/31/2019 11:46   Ct Cervical Spine Wo Contrast  Result Date: 07/31/2019 CLINICAL DATA:  Fall onto face. Left cheek bruising EXAM: CT HEAD WITHOUT CONTRAST CT MAXILLOFACIAL WITHOUT  CONTRAST CT CERVICAL SPINE WITHOUT CONTRAST TECHNIQUE: Multidetector CT imaging of the head, cervical spine, and maxillofacial structures were performed using the standard protocol without intravenous contrast. Multiplanar CT image reconstructions of the cervical spine and maxillofacial structures were also generated. COMPARISON:  03/01/2017, 11/27/2016 FINDINGS: CT HEAD FINDINGS Brain: No evidence of acute infarction, hemorrhage, hydrocephalus, extra-axial collection or mass lesion/mass effect. Extensive low-density changes within the periventricular and subcortical white matter compatible with chronic microvascular ischemic change. Moderate diffuse cerebral volume loss. Vascular: No hyperdense vessel or unexpected calcification. Skull: Normal. Negative for fracture or focal lesion. Other: None. CT MAXILLOFACIAL FINDINGS Osseous: No fracture or mandibular dislocation. No destructive process. Orbits: Negative. No traumatic or inflammatory finding. Sinuses: Clear. Soft tissues: Small soft tissue hematoma just inferior to the left zygomatic process. CT CERVICAL SPINE FINDINGS Alignment: Slightly exaggerated cervical lordosis. No  static listhesis. Facet joints are aligned without dislocation. Dens and lateral masses are aligned. Skull base and vertebrae: No acute fracture. No primary bone lesion or focal pathologic process. Soft tissues and spinal canal: No prevertebral fluid or swelling. No visible canal hematoma. Disc levels: Relative preservation of the intervertebral disc heights. Mild multilevel facet and uncovertebral arthropathy is similar to the prior exam resulting in varying degrees of bilateral foraminal narrowing most pronounced at C5-6 on the left. No evidence of high-grade canal stenosis. Upper chest: Severe emphysematous changes and scarring within the bilateral lung apices. Other: None. IMPRESSION: 1. No acute intracranial abnormality. Atrophy with chronic small vessel ischemic disease. 2. Small soft tissue hematoma just inferior to the left zygomatic process. 3. No acute maxillofacial fracture. 4. Cervical spondylosis without acute osseous abnormality. Electronically Signed   By: Duanne Guess M.D.   On: 07/31/2019 11:46   Ct Maxillofacial Wo Contrast  Result Date: 07/31/2019 CLINICAL DATA:  Fall onto face. Left cheek bruising EXAM: CT HEAD WITHOUT CONTRAST CT MAXILLOFACIAL WITHOUT CONTRAST CT CERVICAL SPINE WITHOUT CONTRAST TECHNIQUE: Multidetector CT imaging of the head, cervical spine, and maxillofacial structures were performed using the standard protocol without intravenous contrast. Multiplanar CT image reconstructions of the cervical spine and maxillofacial structures were also generated. COMPARISON:  03/01/2017, 11/27/2016 FINDINGS: CT HEAD FINDINGS Brain: No evidence of acute infarction, hemorrhage, hydrocephalus, extra-axial collection or mass lesion/mass effect. Extensive low-density changes within the periventricular and subcortical white matter compatible with chronic microvascular ischemic change. Moderate diffuse cerebral volume loss. Vascular: No hyperdense vessel or unexpected calcification. Skull:  Normal. Negative for fracture or focal lesion. Other: None. CT MAXILLOFACIAL FINDINGS Osseous: No fracture or mandibular dislocation. No destructive process. Orbits: Negative. No traumatic or inflammatory finding. Sinuses: Clear. Soft tissues: Small soft tissue hematoma just inferior to the left zygomatic process. CT CERVICAL SPINE FINDINGS Alignment: Slightly exaggerated cervical lordosis. No static listhesis. Facet joints are aligned without dislocation. Dens and lateral masses are aligned. Skull base and vertebrae: No acute fracture. No primary bone lesion or focal pathologic process. Soft tissues and spinal canal: No prevertebral fluid or swelling. No visible canal hematoma. Disc levels: Relative preservation of the intervertebral disc heights. Mild multilevel facet and uncovertebral arthropathy is similar to the prior exam resulting in varying degrees of bilateral foraminal narrowing most pronounced at C5-6 on the left. No evidence of high-grade canal stenosis. Upper chest: Severe emphysematous changes and scarring within the bilateral lung apices. Other: None. IMPRESSION: 1. No acute intracranial abnormality. Atrophy with chronic small vessel ischemic disease. 2. Small soft tissue hematoma just inferior to the left zygomatic process. 3. No acute maxillofacial fracture. 4. Cervical  spondylosis without acute osseous abnormality. Electronically Signed   By: Duanne Guess M.D.   On: 07/31/2019 11:46      Vanetta Mulders, MD 07/31/19 1241

## 2019-07-31 NOTE — ED Notes (Signed)
[employer] notified need for transport back to [facility]. Report given back to facility. Report given to [facility]

## 2019-07-31 NOTE — ED Provider Notes (Signed)
[facility]-EMERGENCY DEPT Provider Note   CSN: [id number] Arrival date & time: 07/31/19  5621     History   Chief Complaint Chief Complaint  Patient presents with   Fall    HPI Christopher Laurel Sr. is a 85 y.o. male with past medical history significant for alcoholism, BPH, CHF, hypertension, CKD stage III, dementia, idiopathic pulmonary fibrosis on 4L [place] O2 presents to emergency department today via EMS with chief complaint of mechanical fall. Pt not anticoagulated.  Patient is pleasantly demented and unable to provide history besides saying "I fell somewhere."   Level 5 caveat applies.  EMS provides history of mechanical fall.  Patient was ambulating and tripped over the leg of a chair causing him to fall.  He fell forward and landed on his face. This was witnessed by staff. No LOC per staff.  He has superficial skin tear to left wrist.   Past Medical History:  Diagnosis Date   Acid reflux    Alcoholism (HCC)    Anxiety    AV block    BPH (benign prostatic hyperplasia)    CHF (congestive heart failure) (HCC)    Depression    High cholesterol    Hypertension    Hypoxia    Pacemaker     Patient Active Problem List   Diagnosis Date Noted   IPF (idiopathic pulmonary fibrosis) (HCC) 10/05/2018   CHF exacerbation (HCC) 09/14/2018   CKD (chronic kidney disease) stage 3, GFR 30-59 ml/min 09/14/2018   Dementia (HCC) 09/14/2018   Chronic pain 09/14/2018   Neuropathy 09/14/2018   HTN (hypertension) 09/14/2018   Overactive bladder 09/14/2018   Depression 09/14/2018   Anxiety 09/14/2018   Physical deconditioning 09/14/2018   Chronic respiratory failure with hypoxia (HCC) 12/21/2017   Chronic rhinitis 09/10/2017    Past Surgical History:  Procedure Laterality Date   CATARACT EXTRACTION, BILATERAL     CHOLECYSTECTOMY     INSERT / REPLACE / REMOVE PACEMAKER          Home Medications    Prior to Admission medications     Medication Sig Start Date End Date Taking? Authorizing Provider  acetaminophen (TYLENOL) 325 MG tablet Take 650 mg by mouth every 6 (six) hours as needed for mild pain.   Yes [provider]  aspirin EC 81 MG tablet Take 81 mg by mouth daily.   Yes [provider]  carvedilol (COREG) 3.125 MG tablet Take 3.125 mg by mouth 2 (two) times daily with a meal.   Yes [provider]  cetirizine (ZYRTEC) 10 MG tablet Take 1 tablet (10 mg total) by mouth daily. 09/10/17  Yes Alfonse Spruce, MD  Cranberry 450 MG TABS Take 450 mg by mouth daily.   Yes [provider]  donepezil (ARICEPT) 10 MG tablet Take 10 mg by mouth at bedtime.   Yes [provider]  famotidine (PEPCID) 20 MG tablet Take 20 mg by mouth at bedtime.   Yes [provider]  fluticasone (FLONASE) 50 MCG/ACT nasal spray Place 2 sprays into both nostrils daily. 09/10/17  Yes Alfonse Spruce, MD  gabapentin (NEURONTIN) 300 MG capsule Take 300 mg by mouth 3 (three) times daily.   Yes [provider]  HYDROcodone-acetaminophen (NORCO/VICODIN) 5-325 MG tablet Take 1 tablet by mouth every 6 (six) hours as needed (for pain). 09/17/18  Yes Tyrone Nine, MD  hydroxypropyl methylcellulose / hypromellose (ISOPTO TEARS / GONIOVISC) 2.5 % ophthalmic solution Place 2 drops into both  eyes daily.   Yes [provider]  ipratropium (ATROVENT) 0.03 % nasal spray Place 2 sprays into both nostrils every 6 (six) hours as needed for rhinitis. Patient taking differently: Place 2 sprays into both nostrils every 6 (six) hours as needed (for allergies).  09/10/17  Yes Valentina Shaggy, MD  LORazepam (ATIVAN) 1 MG tablet Take 1 tablet (1 mg total) by mouth 3 (three) times daily. 09/17/18  Yes Patrecia Pour, MD  magnesium hydroxide (MILK OF MAGNESIA) 400 MG/5ML suspension Take 30 mLs by mouth daily as needed for mild constipation.   Yes [provider]  Melatonin 3 MG TABS Take 3  mg by mouth at bedtime.    Yes [provider]  mirtazapine (REMERON) 30 MG tablet Take 30 mg by mouth at bedtime.   Yes [provider]  montelukast (SINGULAIR) 10 MG tablet Take 1 tablet (10 mg total) by mouth at bedtime. 09/10/17  Yes Valentina Shaggy, MD  Multiple Vitamin (TAB-A-VITE) TABS Take 1 tablet by mouth daily.   Yes [provider]  MYRBETRIQ 25 MG TB24 tablet Take 25 mg by mouth daily. 12/18/17  Yes [provider]  PARoxetine (PAXIL) 30 MG tablet Take 30 mg by mouth daily.    Yes [provider]  polyethylene glycol (MIRALAX / GLYCOLAX) packet Take 17 g by mouth daily.    Yes [provider]  QUEtiapine (SEROQUEL) 25 MG tablet Take 25 mg by mouth 2 (two) times daily.    Yes [provider]  Skin Protectants, Misc. (EUCERIN) cream Apply 1 application topically 3 (three) times daily. Apply to sacral area topically every shift for irritation and redness   Yes [provider]  Skin Protectants, Misc. (EUCERIN) cream Apply 1 application topically every 12 (twelve) hours as needed for dry skin. Apply to behind the ears   Yes [provider]  traMADol (ULTRAM) 50 MG tablet Take 1 tablet (50 mg total) by mouth 3 (three) times daily. 09/17/18  Yes Patrecia Pour, MD  traZODone (DESYREL) 50 MG tablet Take 25 mg by mouth 2 (two) times daily.    Yes [provider]    Family History No family history on file.  Social History Social History   Tobacco Use   Smoking status: Former Smoker   Smokeless tobacco: Never Used  Substance Use Topics   Alcohol use: No   Drug use: No     Allergies   Amoxicillin and Doxycycline   Review of Systems Review of Systems  Unable to perform ROS: Dementia  Skin: Positive for wound.     Physical Exam Updated Vital Signs BP 126/79    Pulse 71    Temp 97.9 F (36.6 C) (Oral)    Resp 16    SpO2 100%   Physical Exam Vitals signs and nursing note  reviewed.  Constitutional:      General: He is not in acute distress.    Appearance: He is not ill-appearing.     Interventions: Cervical collar in place.  HENT:     Head: Normocephalic. No raccoon eyes or Battle's sign.     Jaw: There is normal jaw occlusion.      Comments: No tenderness to palpation of skull. No deformities or crepitus noted. No open wounds, abrasions or lacerations to skull. No pain with EOMs.    Right Ear: Tympanic membrane and external ear normal. No hemotympanum.     Left Ear: Tympanic membrane and external ear normal.  No hemotympanum.     Nose: Nose normal.     Right Nostril: No epistaxis or septal hematoma.     Left Nostril: No epistaxis or septal hematoma.     Mouth/Throat:     Mouth: Mucous membranes are dry.     Pharynx: Oropharynx is clear.  Eyes:     General: No scleral icterus.       Right eye: No discharge.        Left eye: No discharge.     Extraocular Movements: Extraocular movements intact.     Conjunctiva/sclera: Conjunctivae normal.     Pupils: Pupils are equal, round, and reactive to light.  Neck:     Musculoskeletal: Normal range of motion.     Vascular: No JVD.     Comments: As patient is wearing cervical collar unable to assess ROM.  No midline tenderness.  No deformity or step-offs.  No overlying wound or signs of trauma. Cardiovascular:     Rate and Rhythm: Normal rate and regular rhythm.     Pulses: Normal pulses.          Radial pulses are 2+ on the right side and 2+ on the left side.     Heart sounds: Normal heart sounds.  Pulmonary:     Comments: Lungs clear to auscultation in all fields. Symmetric chest rise. No wheezing, rales, or rhonchi. Abdominal:     Comments: Abdomen is soft, non-distended, and non-tender in all quadrants. No rigidity, no guarding. No peritoneal signs.  Musculoskeletal: Normal range of motion.     Comments: Full range of motion of all extremities without signs of injury.  Patient with purposeful movement in  all extremities.  No tenderness to palpation of thoracic or lumbar spine.  Patient has full range of motion of thoracic and lumbar spine. Pelvis is stable.   Skin:    General: Skin is warm and dry.     Capillary Refill: Capillary refill takes less than 2 seconds.  Neurological:     General: No focal deficit present.     GCS: GCS eye subscore is 4. GCS verbal subscore is 5. GCS motor subscore is 6.     Comments: Fluent speech, no facial droop. At baseline per EMS staff and facility staff.  Psychiatric:        Behavior: Behavior normal.      ED Treatments / Results  Labs (all labs ordered are listed, but only abnormal results are displayed) Labs Reviewed - No data to display  EKG None  Radiology Dg Wrist Complete Left  Result Date: 07/31/2019 CLINICAL DATA:  Status post fall. EXAM: LEFT WRIST - COMPLETE 3+ VIEW COMPARISON:  None. FINDINGS: No evidence of fracture. No subluxation or dislocation. Degenerative changes are noted in the carpus and first carpometacarpal joint. IMPRESSION: Degenerative changes without acute bony abnormality. Electronically Signed   By: Kennith CenterEric  Mansell M.D.   On: 07/31/2019 11:51   Ct Head Wo Contrast  Result Date: 07/31/2019 CLINICAL DATA:  Fall onto face. Left cheek bruising EXAM: CT HEAD WITHOUT CONTRAST CT MAXILLOFACIAL WITHOUT CONTRAST CT CERVICAL SPINE WITHOUT CONTRAST TECHNIQUE: Multidetector CT imaging of the head, cervical spine, and maxillofacial structures were performed using the standard protocol without intravenous contrast. Multiplanar CT image reconstructions of the cervical spine and maxillofacial structures were also generated. COMPARISON:  03/01/2017, 11/27/2016 FINDINGS: CT HEAD FINDINGS Brain: No evidence of acute infarction, hemorrhage, hydrocephalus, extra-axial collection or mass lesion/mass effect. Extensive low-density changes within the periventricular and subcortical white matter  compatible with chronic microvascular ischemic  change. Moderate diffuse cerebral volume loss. Vascular: No hyperdense vessel or unexpected calcification. Skull: Normal. Negative for fracture or focal lesion. Other: None. CT MAXILLOFACIAL FINDINGS Osseous: No fracture or mandibular dislocation. No destructive process. Orbits: Negative. No traumatic or inflammatory finding. Sinuses: Clear. Soft tissues: Small soft tissue hematoma just inferior to the left zygomatic process. CT CERVICAL SPINE FINDINGS Alignment: Slightly exaggerated cervical lordosis. No static listhesis. Facet joints are aligned without dislocation. Dens and lateral masses are aligned. Skull base and vertebrae: No acute fracture. No primary bone lesion or focal pathologic process. Soft tissues and spinal canal: No prevertebral fluid or swelling. No visible canal hematoma. Disc levels: Relative preservation of the intervertebral disc heights. Mild multilevel facet and uncovertebral arthropathy is similar to the prior exam resulting in varying degrees of bilateral foraminal narrowing most pronounced at C5-6 on the left. No evidence of high-grade canal stenosis. Upper chest: Severe emphysematous changes and scarring within the bilateral lung apices. Other: None. IMPRESSION: 1. No acute intracranial abnormality. Atrophy with chronic small vessel ischemic disease. 2. Small soft tissue hematoma just inferior to the left zygomatic process. 3. No acute maxillofacial fracture. 4. Cervical spondylosis without acute osseous abnormality. Electronically Signed   By: Duanne Guess M.D.   On: 07/31/2019 11:46   Ct Cervical Spine Wo Contrast  Result Date: 07/31/2019 CLINICAL DATA:  Fall onto face. Left cheek bruising EXAM: CT HEAD WITHOUT CONTRAST CT MAXILLOFACIAL WITHOUT CONTRAST CT CERVICAL SPINE WITHOUT CONTRAST TECHNIQUE: Multidetector CT imaging of the head, cervical spine, and maxillofacial structures were performed using the standard protocol without intravenous contrast. Multiplanar CT image  reconstructions of the cervical spine and maxillofacial structures were also generated. COMPARISON:  03/01/2017, 11/27/2016 FINDINGS: CT HEAD FINDINGS Brain: No evidence of acute infarction, hemorrhage, hydrocephalus, extra-axial collection or mass lesion/mass effect. Extensive low-density changes within the periventricular and subcortical white matter compatible with chronic microvascular ischemic change. Moderate diffuse cerebral volume loss. Vascular: No hyperdense vessel or unexpected calcification. Skull: Normal. Negative for fracture or focal lesion. Other: None. CT MAXILLOFACIAL FINDINGS Osseous: No fracture or mandibular dislocation. No destructive process. Orbits: Negative. No traumatic or inflammatory finding. Sinuses: Clear. Soft tissues: Small soft tissue hematoma just inferior to the left zygomatic process. CT CERVICAL SPINE FINDINGS Alignment: Slightly exaggerated cervical lordosis. No static listhesis. Facet joints are aligned without dislocation. Dens and lateral masses are aligned. Skull base and vertebrae: No acute fracture. No primary bone lesion or focal pathologic process. Soft tissues and spinal canal: No prevertebral fluid or swelling. No visible canal hematoma. Disc levels: Relative preservation of the intervertebral disc heights. Mild multilevel facet and uncovertebral arthropathy is similar to the prior exam resulting in varying degrees of bilateral foraminal narrowing most pronounced at C5-6 on the left. No evidence of high-grade canal stenosis. Upper chest: Severe emphysematous changes and scarring within the bilateral lung apices. Other: None. IMPRESSION: 1. No acute intracranial abnormality. Atrophy with chronic small vessel ischemic disease. 2. Small soft tissue hematoma just inferior to the left zygomatic process. 3. No acute maxillofacial fracture. 4. Cervical spondylosis without acute osseous abnormality. Electronically Signed   By: Duanne Guess M.D.   On: 07/31/2019 11:46    Ct Maxillofacial Wo Contrast  Result Date: 07/31/2019 CLINICAL DATA:  Fall onto face. Left cheek bruising EXAM: CT HEAD WITHOUT CONTRAST CT MAXILLOFACIAL WITHOUT CONTRAST CT CERVICAL SPINE WITHOUT CONTRAST TECHNIQUE: Multidetector CT imaging of the head, cervical spine, and maxillofacial structures were performed using the standard protocol without  intravenous contrast. Multiplanar CT image reconstructions of the cervical spine and maxillofacial structures were also generated. COMPARISON:  03/01/2017, 11/27/2016 FINDINGS: CT HEAD FINDINGS Brain: No evidence of acute infarction, hemorrhage, hydrocephalus, extra-axial collection or mass lesion/mass effect. Extensive low-density changes within the periventricular and subcortical white matter compatible with chronic microvascular ischemic change. Moderate diffuse cerebral volume loss. Vascular: No hyperdense vessel or unexpected calcification. Skull: Normal. Negative for fracture or focal lesion. Other: None. CT MAXILLOFACIAL FINDINGS Osseous: No fracture or mandibular dislocation. No destructive process. Orbits: Negative. No traumatic or inflammatory finding. Sinuses: Clear. Soft tissues: Small soft tissue hematoma just inferior to the left zygomatic process. CT CERVICAL SPINE FINDINGS Alignment: Slightly exaggerated cervical lordosis. No static listhesis. Facet joints are aligned without dislocation. Dens and lateral masses are aligned. Skull base and vertebrae: No acute fracture. No primary bone lesion or focal pathologic process. Soft tissues and spinal canal: No prevertebral fluid or swelling. No visible canal hematoma. Disc levels: Relative preservation of the intervertebral disc heights. Mild multilevel facet and uncovertebral arthropathy is similar to the prior exam resulting in varying degrees of bilateral foraminal narrowing most pronounced at C5-6 on the left. No evidence of high-grade canal stenosis. Upper chest: Severe emphysematous changes and  scarring within the bilateral lung apices. Other: None. IMPRESSION: 1. No acute intracranial abnormality. Atrophy with chronic small vessel ischemic disease. 2. Small soft tissue hematoma just inferior to the left zygomatic process. 3. No acute maxillofacial fracture. 4. Cervical spondylosis without acute osseous abnormality. Electronically Signed   By: Duanne Guess M.D.   On: 07/31/2019 11:46    Procedures Procedures (including critical care time)  Medications Ordered in ED Medications  bacitracin ointment (has no administration in time range)     Initial Impression / Assessment and Plan / ED Course  I have reviewed the triage vital signs and the nursing notes.  Pertinent labs & imaging results that were available during my care of the patient were reviewed by me and considered in my medical decision making (see chart for details).  Patient seen and examined. Patient nontoxic appearing, in no apparent distress, vitals WNL patient unable to remember what happened, report obtained by EMS and facility staff.  Patient mechanical fall.  Arrives in c-collar.  On exam he has bruising to left cheek and superficial skin tears to his left wrist.  No tenderness when palpated from head to toe.  Full range of motion of left wrist.  Brisk cap refill.  Patient is neurovascularly intact.  CT head, cervical spines, maxillofacial without acute findings.  No signs of fracture or bleeding.  Cervical spine is cleared and collar was removed.  X-ray of left wrist viewed by me without acute findings, no fracture.  Patient was able to ambulate with assistance and had steady gait.  Wound care performed and dressing applied to left wrist.  Skin tears are not amenable to suture repair.  The patient appears reasonably screened and/or stabilized for discharge and I doubt any other medical condition or other [facility] requiring further screening, evaluation, or treatment in the ED at this time prior to discharge. The patient  is safe for discharge to [facility]. The patient was discussed with and seen by Dr. Deretha Emory who agrees with the treatment plan.    Portions of this note were generated with Scientist, clinical (histocompatibility and immunogenetics). Dictation errors may occur despite best attempts at proofreading.    Final Clinical Impressions(s) / ED Diagnoses   Final diagnoses:  Fall, initial encounter  ED Discharge Orders    None       Kathyrn Lass 07/31/19 1248    Vanetta Mulders, MD 08/08/19 [phone]

## 2019-07-31 NOTE — ED Notes (Signed)
Pt provided with crackers, peanut butter, apple sauce, and water

## 2019-07-31 NOTE — ED Notes (Signed)
Patient transported to CT 

## 2019-07-31 NOTE — ED Notes (Signed)
Left hand wound irrigated. Bacatracin and Telfa with gauze wrap applied to left hand

## 2019-07-31 NOTE — ED Triage Notes (Addendum)
Pt arrives [facility] from [facility]. Pt had a mechanical fall this am. Bruising noted to left cheek and skin tears to left hand. No LOC per staff. No blood thinners noted. Pt denies complaints. Pt mental status at baseline per EMS/facility. Pt has dementia at baseline.Pt has DNR with him.Pt in c-collar from EMS

## 2019-10-04 ENCOUNTER — Emergency Department (HOSPITAL_COMMUNITY): Payer: Medicare Other

## 2019-10-04 ENCOUNTER — Emergency Department (HOSPITAL_COMMUNITY)
Admission: EM | Admit: 2019-10-04 | Discharge: 2019-10-05 | Disposition: A | Discharge status: Other Acute Inpatient Hospital | Payer: Medicare Other | Attending: Emergency Medicine | Admitting: Emergency Medicine

## 2019-10-04 DIAGNOSIS — F0390 Unspecified dementia without behavioral disturbance: Secondary | ICD-10-CM | POA: Insufficient documentation

## 2019-10-04 DIAGNOSIS — M542 Cervicalgia: Secondary | ICD-10-CM | POA: Diagnosis not present

## 2019-10-04 DIAGNOSIS — Z950 Presence of cardiac pacemaker: Secondary | ICD-10-CM | POA: Insufficient documentation

## 2019-10-04 DIAGNOSIS — R519 Headache, unspecified: Secondary | ICD-10-CM | POA: Insufficient documentation

## 2019-10-04 DIAGNOSIS — N183 Chronic kidney disease, stage 3 unspecified: Secondary | ICD-10-CM | POA: Insufficient documentation

## 2019-10-04 DIAGNOSIS — I130 Hypertensive heart and chronic kidney disease with heart failure and stage 1 through stage 4 chronic kidney disease, or unspecified chronic kidney disease: Secondary | ICD-10-CM | POA: Insufficient documentation

## 2019-10-04 DIAGNOSIS — R0789 Other chest pain: Secondary | ICD-10-CM | POA: Diagnosis not present

## 2019-10-04 DIAGNOSIS — I509 Heart failure, unspecified: Secondary | ICD-10-CM | POA: Insufficient documentation

## 2019-10-04 DIAGNOSIS — W19XXXA Unspecified fall, initial encounter: Secondary | ICD-10-CM | POA: Diagnosis not present

## 2019-10-04 DIAGNOSIS — Z79899 Other long term (current) drug therapy: Secondary | ICD-10-CM | POA: Diagnosis not present

## 2019-10-04 DIAGNOSIS — M549 Dorsalgia, unspecified: Secondary | ICD-10-CM | POA: Diagnosis not present

## 2019-10-04 DIAGNOSIS — Z87891 Personal history of nicotine dependence: Secondary | ICD-10-CM | POA: Insufficient documentation

## 2019-10-04 DIAGNOSIS — Z7982 Long term (current) use of aspirin: Secondary | ICD-10-CM | POA: Insufficient documentation

## 2019-10-04 DIAGNOSIS — R42 Dizziness and giddiness: Secondary | ICD-10-CM | POA: Insufficient documentation

## 2019-10-04 LAB — LACTIC ACID, PLASMA
Lactic Acid, Venous: 2.1 mmol/L (ref 0.5–1.9)
Lactic Acid, Venous: 1.6 mmol/L (ref 0.5–1.9)

## 2019-10-04 LAB — URINALYSIS, ROUTINE W REFLEX MICROSCOPIC
Bilirubin Urine: NEGATIVE
Glucose, UA: NEGATIVE mg/dL
Hgb urine dipstick: NEGATIVE
Ketones, ur: NEGATIVE mg/dL
Leukocytes,Ua: NEGATIVE
Nitrite: NEGATIVE
Protein, ur: 30 mg/dL — AB
Specific Gravity, Urine: 1.029 (ref 1.005–1.030)
pH: 5 (ref 5.0–8.0)

## 2019-10-04 LAB — CBC
HCT: 45.1 % (ref 39.0–52.0)
Hemoglobin: 14.4 g/dL (ref 13.0–17.0)
MCH: 31.2 pg (ref 26.0–34.0)
MCHC: 31.9 g/dL (ref 30.0–36.0)
MCV: 97.6 fL (ref 80.0–100.0)
Platelets: 142 10*3/uL — ABNORMAL LOW (ref 150–400)
RBC: 4.62 MIL/uL (ref 4.22–5.81)
RDW: 14.5 % (ref 11.5–15.5)
WBC: 9.1 10*3/uL (ref 4.0–10.5)
nRBC: 0 % (ref 0.0–0.2)

## 2019-10-04 LAB — COMPREHENSIVE METABOLIC PANEL
ALT: 24 U/L (ref 0–44)
AST: 32 U/L (ref 15–41)
Albumin: 3.5 g/dL (ref 3.5–5.0)
Alkaline Phosphatase: 51 U/L (ref 38–126)
Anion gap: 9 (ref 5–15)
BUN: 39 mg/dL — ABNORMAL HIGH (ref 8–23)
CO2: 26 mmol/L (ref 22–32)
Calcium: 9.1 mg/dL (ref 8.9–10.3)
Chloride: 109 mmol/L (ref 98–111)
Creatinine, Ser: 1.25 mg/dL — ABNORMAL HIGH (ref 0.61–1.24)
GFR calc Af Amer: 59 mL/min — ABNORMAL LOW (ref >60)
GFR calc non Af Amer: 51 mL/min — ABNORMAL LOW (ref >60)
Glucose, Bld: 112 mg/dL — ABNORMAL HIGH (ref 70–99)
Potassium: 4.5 mmol/L (ref 3.5–5.1)
Sodium: 144 mmol/L (ref 135–145)
Total Bilirubin: 0.7 mg/dL (ref 0.3–1.2)
Total Protein: 6.4 g/dL — ABNORMAL LOW (ref 6.5–8.1)

## 2019-10-04 MED ORDER — SODIUM CHLORIDE 0.9 % IV BOLUS
500.0000 mL | Freq: Once | INTRAVENOUS | Status: AC
  Administered 2019-10-04: 500 mL via INTRAVENOUS

## 2019-10-04 NOTE — ED Notes (Signed)
Pt removed external urine management system. Provided him a urinal.

## 2019-10-04 NOTE — ED Notes (Signed)
Heard bed alarm while I was providing pt care in another room. Immediately went to pt's room. Pt was out of bed and standing at the door with his brief in his left hand. Directed pt back to the bed, reapplied his ECG leads, O2, BP cuff, and nasal canula. Encouraged him to stay in the bed. He requested a "nerve pill" to help him sleep.

## 2019-10-04 NOTE — ED Triage Notes (Signed)
Transported by [facility] from [place]-- unwitnessed fall that occurred today. Patient has a history of dementia AAO x 1. States he became dizzy while walking. Initial BP of 99/50. Unknown if he hit his head, no blood thinners noted in MAR. Recently diagnosed and being treated for pna. CBG 188 mg/dl. VSS.

## 2019-10-04 NOTE — ED Notes (Signed)
Heard bed alarm. NT and I entered the room. Pt at the bottom of the bed, trying to exit the bed. Pt said he had to urinate. Assisted him back to bed. Provided a urinal. Applied [employer] external urine management system.

## 2019-10-04 NOTE — ED Provider Notes (Addendum)
[facility]-EMERGENCY DEPT Provider Note   CSN: [id number] Arrival date & time: 10/04/19  1447     History Chief Complaint  Patient presents with  . Fall    Christopher Copelan Sr. is a 86 y.o. male.  The history is provided by the patient and medical records. The history is limited by the condition of the patient.  Fall This is a recurrent problem. The current episode started 1 to 2 hours ago. The problem occurs rarely. The problem has not changed since onset.Associated symptoms include chest pain and headaches. Pertinent negatives include no abdominal pain and no shortness of breath. Nothing aggravates the symptoms. Nothing relieves the symptoms. He has tried nothing for the symptoms. The treatment provided no relief.       Past Medical History:  Diagnosis Date  . Acid reflux   . Alcoholism (HCC)   . Anxiety   . AV block   . BPH (benign prostatic hyperplasia)   . CHF (congestive heart failure) (HCC)   . Depression   . High cholesterol   . Hypertension   . Hypoxia   . Pacemaker     Patient Active Problem List   Diagnosis Date Noted  . IPF (idiopathic pulmonary fibrosis) (HCC) 10/05/2018  . CHF exacerbation (HCC) 09/14/2018  . CKD (chronic kidney disease) stage 3, GFR 30-59 ml/min 09/14/2018  . Dementia (HCC) 09/14/2018  . Chronic pain 09/14/2018  . Neuropathy 09/14/2018  . HTN (hypertension) 09/14/2018  . Overactive bladder 09/14/2018  . Depression 09/14/2018  . Anxiety 09/14/2018  . Physical deconditioning 09/14/2018  . Chronic respiratory failure with hypoxia (HCC) 12/21/2017  . Chronic rhinitis 09/10/2017    Past Surgical History:  Procedure Laterality Date  . CATARACT EXTRACTION, BILATERAL    . CHOLECYSTECTOMY    . INSERT / REPLACE / REMOVE PACEMAKER         No family history on file.  Social History   Tobacco Use  . Smoking status: Former Games developer  . Smokeless tobacco: Never Used  Substance Use Topics  . Alcohol use: No  . Drug  use: No    Home Medications Prior to Admission medications   Medication Sig Start Date End Date Taking? Authorizing Provider  acetaminophen (TYLENOL) 325 MG tablet Take 650 mg by mouth every 6 (six) hours as needed for mild pain.    [provider]  aspirin EC 81 MG tablet Take 81 mg by mouth daily.    [provider]  carvedilol (COREG) 3.125 MG tablet Take 3.125 mg by mouth 2 (two) times daily with a meal.    [provider]  cetirizine (ZYRTEC) 10 MG tablet Take 1 tablet (10 mg total) by mouth daily. 09/10/17   Alfonse Spruce, MD  Cranberry 450 MG TABS Take 450 mg by mouth daily.    [provider]  donepezil (ARICEPT) 10 MG tablet Take 10 mg by mouth at bedtime.    [provider]  famotidine (PEPCID) 20 MG tablet Take 20 mg by mouth at bedtime.    [provider]  fluticasone (FLONASE) 50 MCG/ACT nasal spray Place 2 sprays into both nostrils daily. 09/10/17   Alfonse Spruce, MD  gabapentin (NEURONTIN) 300 MG capsule Take 300 mg by mouth 3 (three) times daily.    [provider]  HYDROcodone-acetaminophen (NORCO/VICODIN) 5-325 MG tablet Take 1 tablet by mouth every 6 (six) hours as needed (for pain). 09/17/18   Tyrone Nine, MD  hydroxypropyl methylcellulose / hypromellose (ISOPTO  TEARS / GONIOVISC) 2.5 % ophthalmic solution Place 2 drops into both eyes daily.    [provider]  ipratropium (ATROVENT) 0.03 % nasal spray Place 2 sprays into both nostrils every 6 (six) hours as needed for rhinitis. Patient taking differently: Place 2 sprays into both nostrils every 6 (six) hours as needed (for allergies).  09/10/17   Valentina Shaggy, MD  LORazepam (ATIVAN) 1 MG tablet Take 1 tablet (1 mg total) by mouth 3 (three) times daily. 09/17/18   Patrecia Pour, MD  magnesium hydroxide (MILK OF MAGNESIA) 400 MG/5ML suspension Take 30 mLs by mouth daily as needed for mild constipation.    [provider]    Melatonin 3 MG TABS Take 3 mg by mouth at bedtime.     [provider]  mirtazapine (REMERON) 30 MG tablet Take 30 mg by mouth at bedtime.    [provider]  montelukast (SINGULAIR) 10 MG tablet Take 1 tablet (10 mg total) by mouth at bedtime. 09/10/17   Valentina Shaggy, MD  Multiple Vitamin (TAB-A-VITE) TABS Take 1 tablet by mouth daily.    [provider]  MYRBETRIQ 25 MG TB24 tablet Take 25 mg by mouth daily. 12/18/17   [provider]  PARoxetine (PAXIL) 30 MG tablet Take 30 mg by mouth daily.     [provider]  polyethylene glycol (MIRALAX / GLYCOLAX) packet Take 17 g by mouth daily.     [provider]  QUEtiapine (SEROQUEL) 25 MG tablet Take 25 mg by mouth 2 (two) times daily.     [provider]  Skin Protectants, Misc. (EUCERIN) cream Apply 1 application topically 3 (three) times daily. Apply to sacral area topically every shift for irritation and redness    [provider]  Skin Protectants, Misc. (EUCERIN) cream Apply 1 application topically every 12 (twelve) hours as needed for dry skin. Apply to behind the ears    [provider]  traMADol (ULTRAM) 50 MG tablet Take 1 tablet (50 mg total) by mouth 3 (three) times daily. 09/17/18   Patrecia Pour, MD  traZODone (DESYREL) 50 MG tablet Take 25 mg by mouth 2 (two) times daily.     [provider]    Allergies    Amoxicillin and Doxycycline  Review of Systems   Review of Systems  Unable to perform ROS: Dementia  Constitutional: Negative for chills, diaphoresis, fatigue and fever.  HENT: Negative for congestion.   Eyes: Negative for visual disturbance.  Respiratory: Negative for cough, choking, chest tightness, shortness of breath and wheezing.   Cardiovascular: Positive for chest pain. Negative for palpitations and leg swelling.  Gastrointestinal: Negative for abdominal pain, constipation, diarrhea, nausea and vomiting.  Genitourinary:  Negative for dysuria and flank pain.  Musculoskeletal: Positive for back pain and neck pain. Negative for neck stiffness.  Skin: Negative for rash and wound.  Neurological: Positive for light-headedness and headaches. Negative for dizziness and syncope.  Psychiatric/Behavioral: Negative for agitation.  All other systems reviewed and are negative.   Physical Exam Updated Vital Signs BP 99/66 (BP Location: Right Arm)   Pulse 78   Temp 97.7 F (36.5 C) (Oral)   Resp (!) 26   SpO2 96%   Physical Exam Vitals and nursing note reviewed.  Constitutional:      General: He is not in acute distress.    Appearance: He is well-developed. He is not ill-appearing, toxic-appearing or diaphoretic.  HENT:     Head: Normocephalic  and atraumatic.     Nose: No congestion or rhinorrhea.     Mouth/Throat:     Mouth: Mucous membranes are moist.     Pharynx: No oropharyngeal exudate or posterior oropharyngeal erythema.  Eyes:     Extraocular Movements: Extraocular movements intact.     Conjunctiva/sclera: Conjunctivae normal.     Pupils: Pupils are equal, round, and reactive to light.  Neck:   Cardiovascular:     Rate and Rhythm: Normal rate and regular rhythm.     Pulses: Normal pulses.     Heart sounds: No murmur.  Pulmonary:     Effort: Pulmonary effort is normal. No respiratory distress.     Breath sounds: Normal breath sounds. No wheezing, rhonchi or rales.  Chest:     Chest wall: Tenderness present.    Abdominal:     General: Abdomen is flat.     Palpations: Abdomen is soft.     Tenderness: There is no abdominal tenderness. There is no right CVA tenderness, left CVA tenderness or rebound.  Musculoskeletal:        General: Tenderness present. No swelling.     Cervical back: Neck supple. Tenderness present. Muscular tenderness present. No spinous process tenderness.       Back:     Right lower leg: No edema.     Left lower leg: No edema.       Legs:     Comments: Mild superficial  tenderness on left lateral hip area.  On reassessment pain completely resolved.  Normal range of motion and no crepitance.  Normal sensation and strength in the legs.  No erythema or warmth present.  Skin:    General: Skin is warm and dry.     Capillary Refill: Capillary refill takes less than 2 seconds.     Findings: No erythema or rash.  Neurological:     General: No focal deficit present.     Mental Status: He is alert.     Sensory: No sensory deficit.     Motor: No weakness.  Psychiatric:        Mood and Affect: Mood normal.     ED Results / Procedures / Treatments   Labs (all labs ordered are listed, but only abnormal results are displayed) Labs Reviewed  CBC - Abnormal; Notable for the following components:      Result Value   Platelets 142 (*)    All other components within normal limits  LACTIC ACID, PLASMA - Abnormal; Notable for the following components:   Lactic Acid, Venous 2.1 (*)    All other components within normal limits  URINALYSIS, ROUTINE W REFLEX MICROSCOPIC - Abnormal; Notable for the following components:   Color, Urine AMBER (*)    Protein, ur 30 (*)    Bacteria, UA RARE (*)    All other components within normal limits  COMPREHENSIVE METABOLIC PANEL - Abnormal; Notable for the following components:   Glucose, Bld 112 (*)    BUN 39 (*)    Creatinine, Ser 1.25 (*)    Total Protein 6.4 (*)    GFR calc non Af Amer 51 (*)    GFR calc Af Amer 59 (*)    All other components within normal limits  URINE CULTURE  LACTIC ACID, PLASMA    EKG None  Radiology DG Chest 2 View  Result Date: 10/04/2019 CLINICAL DATA:  Fall EXAM: CHEST - 2 VIEW COMPARISON:  06/22/2019 FINDINGS: Low lung volumes. Bilateral interstitial opacities again seen. Pulmonary  vascular congestion. Stable cardiomediastinal contours. No pleural effusion or pneumothorax. IMPRESSION: Chronic interstitial lung disease and pulmonary vascular congestion. No definite superimposed acute process.  Electronically Signed   By: Guadlupe SpanishPraneil  Patel M.D.   On: 10/04/2019 16:29   DG Lumbar Spine Complete  Result Date: 10/04/2019 CLINICAL DATA:  Fall with back pain EXAM: LUMBAR SPINE - COMPLETE 4+ VIEW COMPARISON:  03/01/2017 FINDINGS: Mild scoliosis of the spine. Trace retrolisthesis L2 on L3. Vertebral body heights are maintained. Multiple level degenerative changes of the lumbar spine, most advanced at L2-L3 and L5-S1. Aortic atherosclerosis. Facet degenerative change of the lower lumbar spine. Destructive arthropathy at the left hip IMPRESSION: 1. Scoliosis and degenerative changes of the lumbar spine. No acute osseous abnormality 2. Destructive arthropathy at the left hip Electronically Signed   By: Jasmine PangKim  Fujinaga M.D.   On: 10/04/2019 16:34   CT Head Wo Contrast  Result Date: 10/04/2019 CLINICAL DATA:  Status post fall. EXAM: CT HEAD WITHOUT CONTRAST TECHNIQUE: Contiguous axial images were obtained from the base of the skull through the vertex without intravenous contrast. COMPARISON:  July 31, 2019 FINDINGS: Brain: There is moderate severity cerebral atrophy with widening of the extra-axial spaces and ventricular dilatation. There are areas of decreased attenuation within the white matter tracts of the supratentorial brain, consistent with microvascular disease changes. A small area of cortical encephalomalacia, with adjacent chronic white matter low attenuation, is seen along the parasagittal region of the right occipital lobe. This is present on the prior exam. A small chronic left para thalamic lacunar infarct is seen. Vascular: No hyperdense vessel or unexpected calcification. Skull: Normal. Negative for fracture or focal lesion. Sinuses/Orbits: No acute finding. Other: None. IMPRESSION: 1. No acute intracranial pathology. 2. Age-related atrophy and microvascular disease within the white matter tracts of the supratentorial brain. Electronically Signed   By: Aram Candelahaddeus  Houston M.D.   On: 10/04/2019  16:45   CT Cervical Spine Wo Contrast  Result Date: 10/04/2019 CLINICAL DATA:  Status post fall. EXAM: CT CERVICAL SPINE WITHOUT CONTRAST TECHNIQUE: Multidetector CT imaging of the cervical spine was performed without intravenous contrast. Multiplanar CT image reconstructions were also generated. COMPARISON:  July 31, 2019 FINDINGS: Alignment: Normal. Skull base and vertebrae: No acute fracture. No primary bone lesion or focal pathologic process. Soft tissues and spinal canal: No prevertebral fluid or swelling. No visible canal hematoma. Disc levels: C2-3: There is mild end plate spondylosis. Mild disc space narrowing is seen. Bilateral facet hypertrophy is noted. Normal central canal and intervertebral neuroforamina. C3-4: There is mild end plate spondylosis. Mild disc space narrowing is seen. Bilateral facet hypertrophy is noted. Normal central canal and intervertebral neuroforamina. C4-5: There is mild end plate spondylosis. Mild disc space narrowing is seen. Bilateral facet hypertrophy is noted. Normal central canal and intervertebral neuroforamina. C5-6: There is mild end plate spondylosis. Moderate to marked severity posterior disc space narrowing is seen. Bilateral facet hypertrophy is noted. Normal central canal and intervertebral neuroforamina. C6-7: There is mild end plate spondylosis. Moderate to marked severity posterior disc space narrowing is seen. Bilateral facet hypertrophy is noted. Normal central canal and intervertebral neuroforamina. C7-T1: There is mild end plate spondylosis. Moderate to marked severity posterior disc space narrowing is seen. Bilateral facet hypertrophy is noted. Normal central canal and intervertebral neuroforamina. Upper chest: Negative. Other: None. IMPRESSION: 1. Multilevel degenerative changes noted in the cervical spine without fracture. Electronically Signed   By: Aram Candelahaddeus  Houston M.D.   On: 10/04/2019 16:38   DG Shoulder Left  Result Date:  10/04/2019 CLINICAL DATA:  Status post fall. EXAM: LEFT SHOULDER - 2+ VIEW COMPARISON:  None. FINDINGS: Normal glenohumeral articulation. Normal left acromioclavicular joint. Normal left acromion. Normal left humeral head and visualized proximal left humerus. Normal visualized left scapula. There is no demonstrated soft tissue abnormality. A dual lead AICD is noted. Increased perihilar pulmonary vasculature is seen within the visualized portion of the left lung. IMPRESSION: 1. No acute osseous abnormality. Electronically Signed   By: Aram Candela M.D.   On: 10/04/2019 16:41   DG Hip Unilat W or Wo Pelvis 2-3 Views Left  Result Date: 10/04/2019 CLINICAL DATA:  Unwitnessed fall EXAM: DG HIP (WITH OR WITHOUT PELVIS) 2-3V LEFT COMPARISON:  05/23/2015, CT 06/20/2018 FINDINGS: SI joints are non widened. The pubic symphysis and rami are intact. Right femoral head projects in joint. Old fracture deformity of the proximal shaft of the left femur. Abnormal left hip joint space which is oblique aerated. Acetabular and femoral head lucent changes. IMPRESSION: 1. No acute fracture or dislocation is visualized. Old fracture deformity proximal shaft of left femur 2. Abnormal appearance of the left hip joint with effacement of the joint space, and heterogenous sclerosis and lucency. Radiographic appearance raises concern for septic arthritis. Electronically Signed   By: Jasmine Pang M.D.   On: 10/04/2019 16:32    Procedures Procedures (including critical care time)  Medications Ordered in ED Medications  sodium chloride 0.9 % bolus 500 mL (0 mLs Intravenous Stopped 10/04/19 1636)    ED Course  I have reviewed the triage vital signs and the nursing notes.  Pertinent labs & imaging results that were available during my care of the patient were reviewed by me and considered in my medical decision making (see chart for details).  Clinical Course as of Oct 03 1920  Tue Oct 04, 2019  1649 CT Head Wo Contrast  [CT]    Clinical Course User Index [CT] Marybel Alcott, Canary Brim, MD   MDM Rules/Calculators/A&P                      Drako Maese Sr. is a 86 y.o. male with a past medical history significant for CHF status post pacemaker, dementia, CKD, hypertension, idiopathic pulmonary fibrosis on 4 L at baseline and reportedly currently being treated for pneumonia who presents for a unwitnessed fall.  According to EMS report and nursing, patient had an unwitnessed fall today at his nursing facility.  Patient reports that he remembers the fall and said that while walking he got lightheaded and fell over.  He denies losing consciousness.  He does think he hit his head.  He reports mild headache and neck pain as well as left shoulder pain, left upper chest pain, and left hip pain.  He also reports a mild pain in his lower back near his tailbone.  He denies loss of bowel or bladder control.  Denies numbness, tingling, weakness of extremities.  Denies nausea, vomiting, shortness of breath, or other complaints.  He reports his pain is mild right now.  Vital signs on arrival show soft blood pressure with blood pressure in the 90s systolic.  He will given some fluids.  Leg do not seem swollen and lungs did not have significant rales on exam.  Chest was tender in left upper chest as well as the left clavicle area and left shoulder.  Good grip strength bilaterally.  Good pulses in extremities.  Normal sensation throughout.  Good strength in bilateral lower extremities.  Normal pulses in lower extremities.  Some tenderness in the left hip area.  No significant tenderness in the lumbar spine however he reports he was having some mild pain back after the fall.  Some tenderness present in the paraspinal neck in the upper neck.  No laceration seen on exam.  We will get CT imaging of the head and neck as well as chest x-ray, left shoulder x-ray, left hip x-ray, and lumbar x-ray.  We will get some screening labs as his blood pressure  on the softer side and he reportedly currently has pneumonia.  Anticipate reassessment after work-up.  7:22 PM On reassessment, patient was able to stand without falling.  He had no pain in his left hip on reassessment.  He had good range of motion and rotation of the hip without any pain in the left hip.  X-ray did return showing no fracture dislocation but possible septic arthritis.  We will see what labs show and reassess patient.  Based on reassessment I have a very low suspicion for infection in the hip at this time.  10:48 PM Patient's labs returned with improving lactic acid and no leukocytosis.  He is afebrile.  Again on my reassessment he had no hip pain whatsoever.  Extremely low suspicion for septic joint at this time.  I discussed with patient getting further imaging and he agrees with going to go home instead.  We will have patient follow-up with his PCP and if he develops any left hip symptoms or pain, he would like to need to come back to emergency department for further imaging.  Patient will be discharged for outpatient management.  Final Clinical Impression(s) / ED Diagnoses Final diagnoses:  Fall, initial encounter     Clinical Impression: 1. Fall, initial encounter     Disposition: Discharge  Condition: Good  I have discussed the results, Dx and Tx plan with the pt(& family if present). He/she/they expressed understanding and agree(s) with the plan. Discharge instructions discussed at great length. Strict return precautions discussed and pt &/or family have verbalized understanding of the instructions. No further questions at time of discharge.    New Prescriptions   No medications on file    Follow Up: Forrest Moron, MD [address] [place] [address] [phone]     [facility]-EMERGENCY DEPT [address] [id number] mc [address] [place] [address] [phone]       Chanay Nugent, Canary Brim,  MD 10/05/19 0024    Ethan Kasperski, Canary Brim, MD 10/05/19 [phone]

## 2019-10-04 NOTE — ED Notes (Signed)
Pt climbed out of bed, and self removed IV. This RN assisted pt back into bed, assisted with urinal, placed bed alarm, and new gown.  output

## 2019-10-04 NOTE — ED Notes (Signed)
Pt transported to Xray. 

## 2019-10-04 NOTE — ED Notes (Signed)
Pt placed on bed alarm

## 2019-10-04 NOTE — ED Notes (Signed)
Called [facility] 

## 2019-10-04 NOTE — Discharge Instructions (Addendum)
His work-up today did not show any acute evidence of fractures dislocations or injuries after the fall.  CT of the head and neck showed no fractures and no bleeding inside his head.   X-ray of the hip and pelvis did not show evidence of fracture or dislocation.  There was some destructive arthropathy which can be related to infection however on reassessment, he had no pain whatsoever with hip manipulation palpation or movement.  Given lack of fever, improving lactic acid, normal white blood cell count, and complete resolution of pain in the hip, very low suspicion for septic arthritis at this time.  I discussed further imaging with patient and he agrees instead to be discharged home and follow-up with his PCP.  Other imaging was reassuring.  If he develops any new or worsening pain in his left hip, is recommended come to the emergency department for further evaluation.

## 2019-10-05 LAB — URINE CULTURE: Culture: NO GROWTH

## 2019-10-05 NOTE — ED Notes (Signed)
Assisted pt to bedside commode for bowel movement then assisted him back to bed.

## 2019-10-05 NOTE — ED Notes (Signed)
Responded to pt bed alarm. Assisted him back to bed.

## 2019-10-05 NOTE — ED Notes (Signed)
Responded to pt's bed alarm. Pt standing beside bed. Assisted him back to bed.

## 2019-10-10 DEATH — deceased

## 2020-10-11 IMAGING — US US SCROTUM W/ DOPPLER COMPLETE
1 series · 13 of 25 positions shown · non-contrast
Comparison: None.

CLINICAL DATA: Scrotal pain.

EXAM:
SCROTAL ULTRASOUND
DOPPLER ULTRASOUND OF THE TESTICLES
TECHNIQUE: Complete ultrasound examination of the testicles, epididymis, and
other scrotal structures was performed. Color and spectral Doppler
ultrasound were also utilized to evaluate blood flow to the
testicles.

[Series 1: us scrotum w/ doppler complete · 62 acquisitions, 13 frames shown]
[im 1/62]
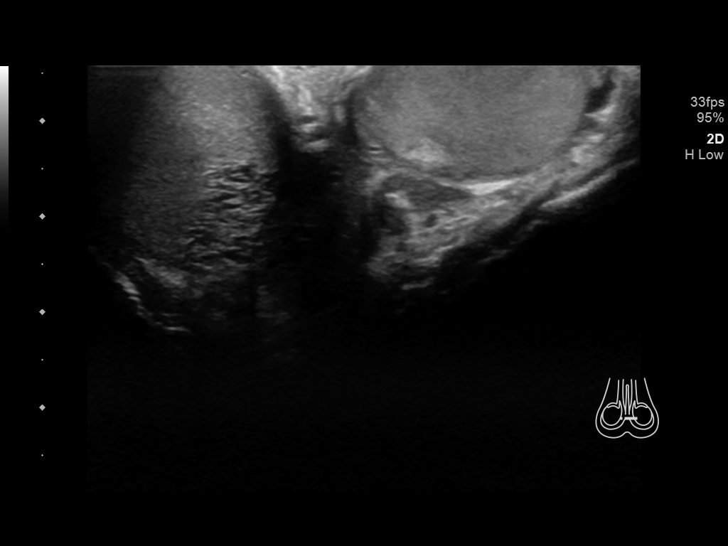
[im 6/62]
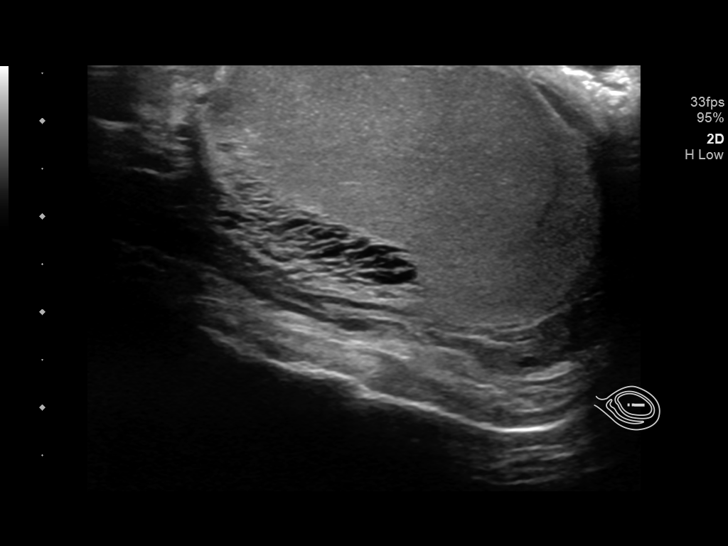
[im 11/62]
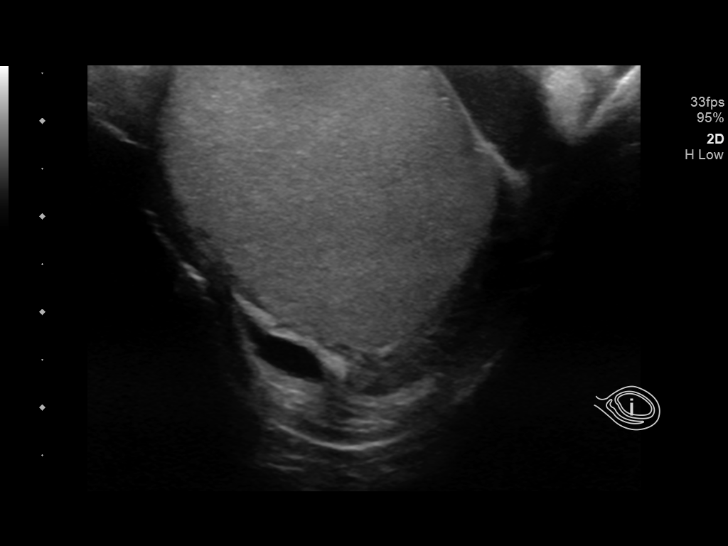
[im 16/62]
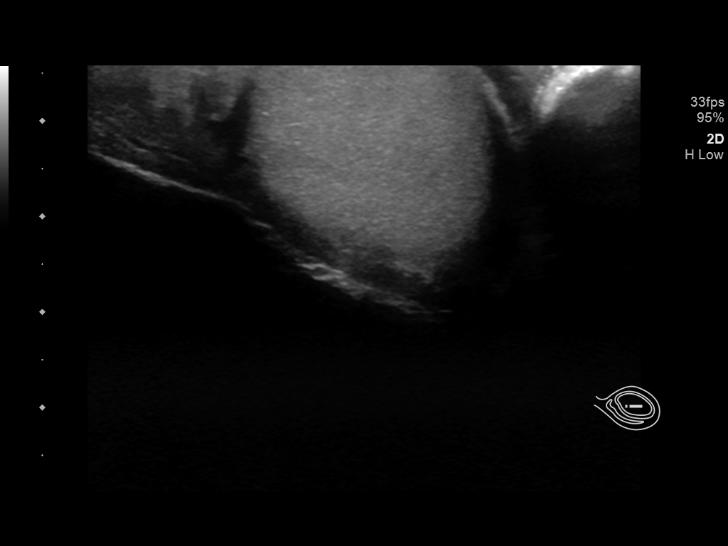
[im 21/62]
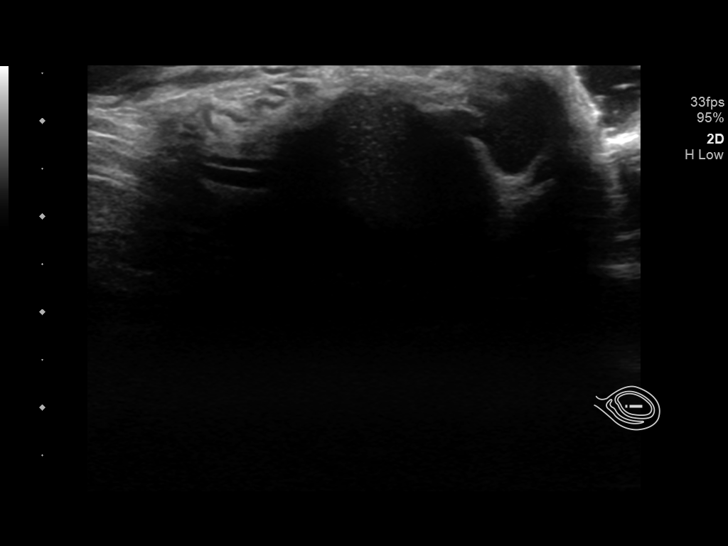
[im 26/62]
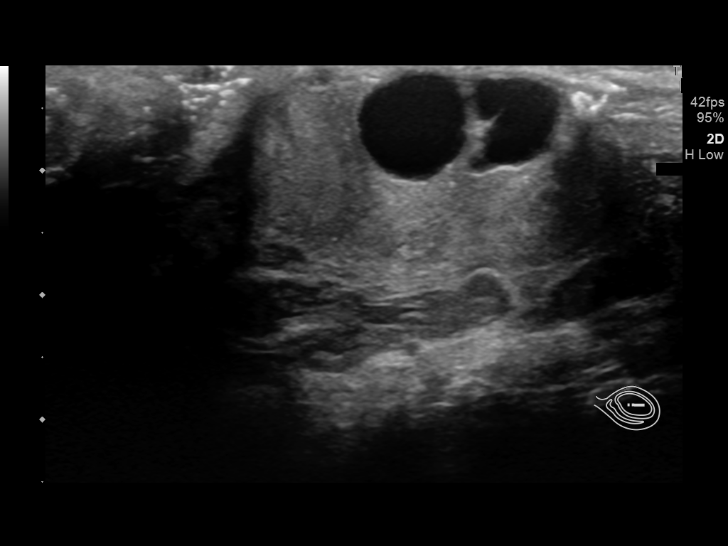
[im 31/62]
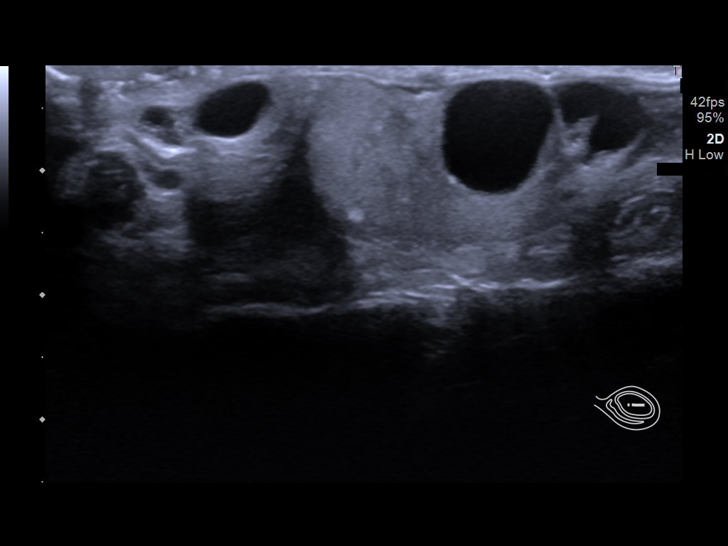
[im 36/62]
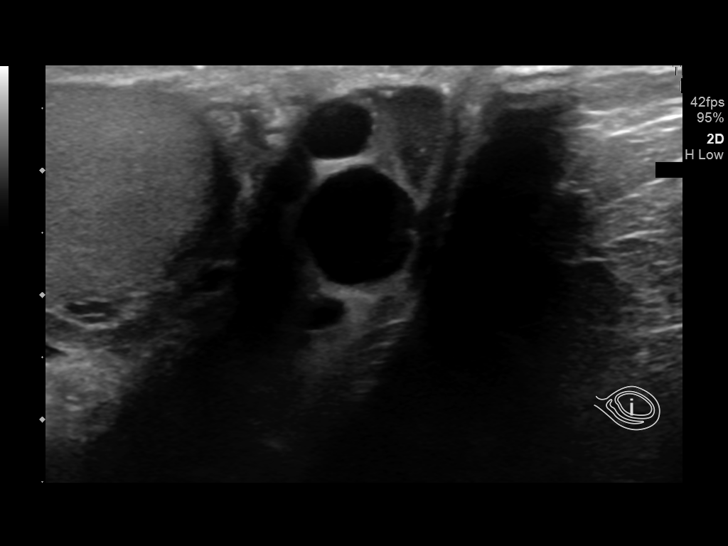
[im 41/62]
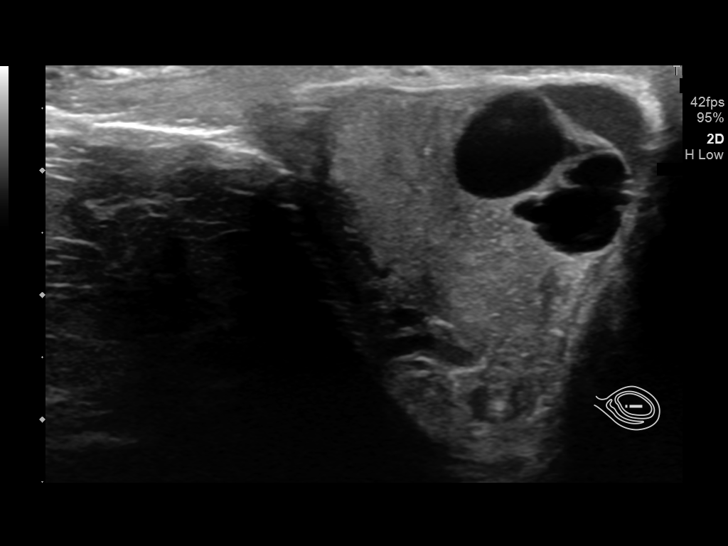
[im 46/62]
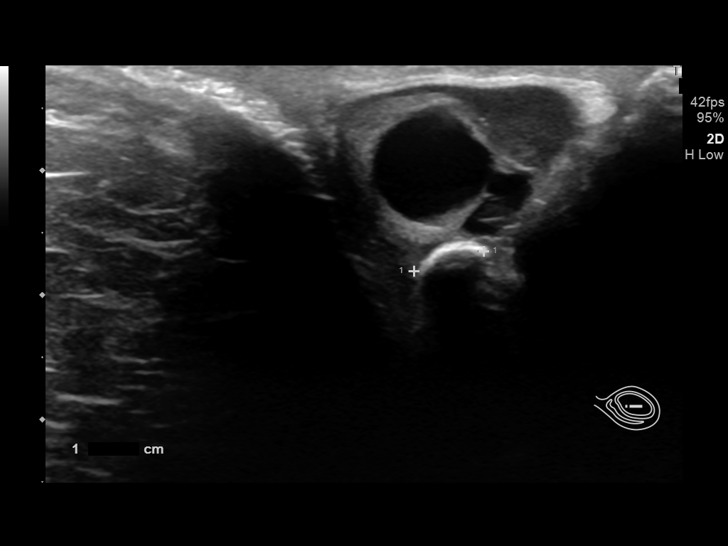
[im 51/62]
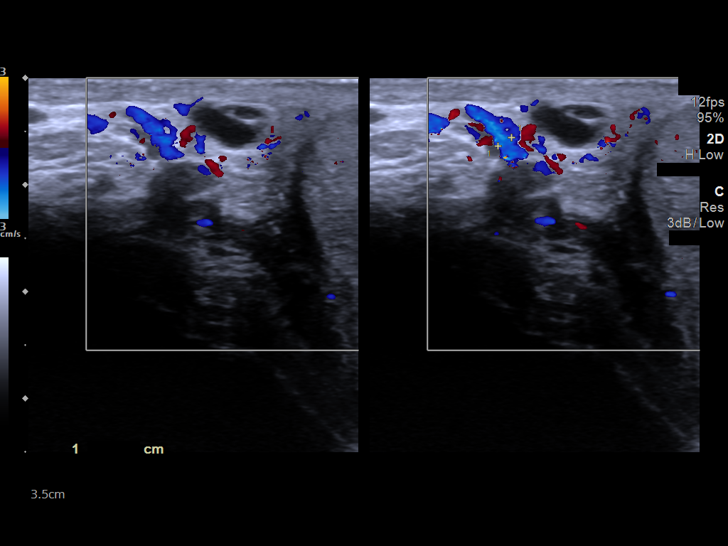
[im 56/62]
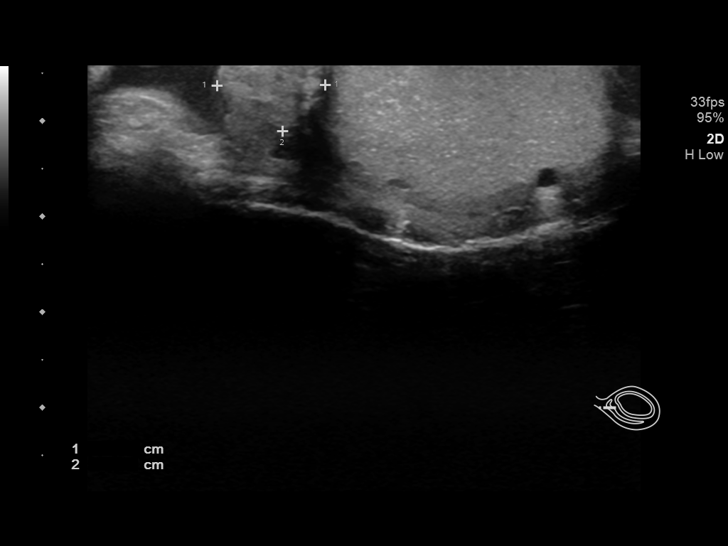
[im 62/62]
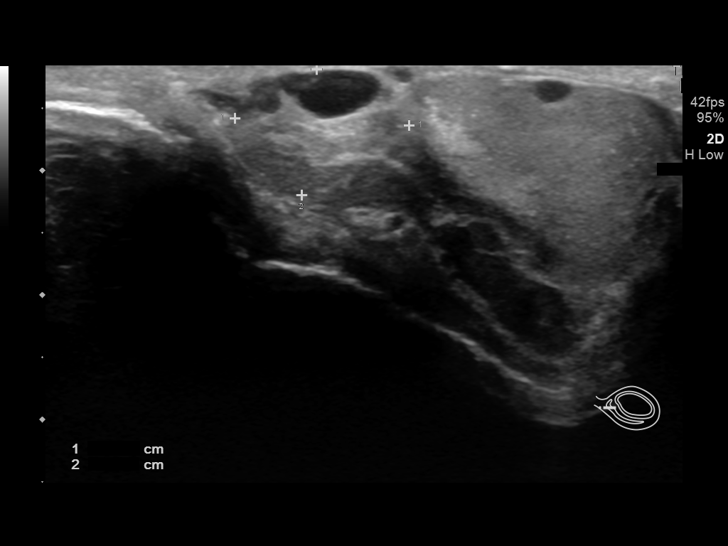

[13 of 25 positions shown; findings below may reference images not displayed]

FINDINGS: Right testicle

Measurements: 4.4 x 2.6 x 3.0 cm. No mass or microlithiasis
visualized. Dilatation of the rete testis.

Left testicle

Measurements: 3.0 x 1.7 x 2.0 cm. There are several simple
appearing, well-defined anechoic lesions with posterior acoustic
enhancement and no internal vascularity measuring up to 9 x 8 x 10
mm. No microlithiasis visualized. There is a 6 mm calcification in
the left lateral scrotal wall, likely a scrotolith.

Right epididymis:  Normal in size and appearance.

Left epididymis: Normal in size and appearance. Several small
epididymal head cysts.

Hydrocele:  Trace bilateral hydroceles.

Varicocele:  None visualized.

Pulsed Doppler interrogation of both testes demonstrates normal low
resistance arterial and venous waveforms bilaterally.
IMPRESSION: 1. No acute abnormality.
2. Several simple cysts versus spermatoceles in the left testicle.

## 2022-01-25 IMAGING — CT CT CERVICAL SPINE W/O CM
3 of 4 series · 13 of 35 positions shown, 16 images · non-contrast
Comparison: July 31, 2019

CLINICAL DATA: Status post fall.

EXAM:
CT CERVICAL SPINE WITHOUT CONTRAST
TECHNIQUE: Multidetector CT imaging of the cervical spine was performed without
intravenous contrast. Multiplanar CT image reconstructions were also
generated.

[Series 6: orthogonal bone · axial · 0.23mm/px · z∈[-269,-161]mm · 5 of 86 slices shown, 7 images]
[im 15/86  soft-tissue]
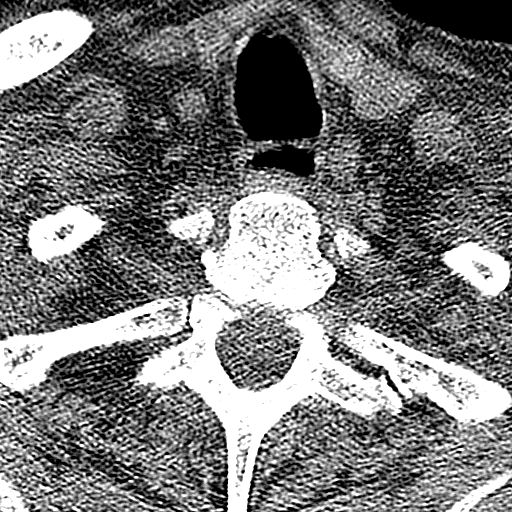
[im 15/86  bone]
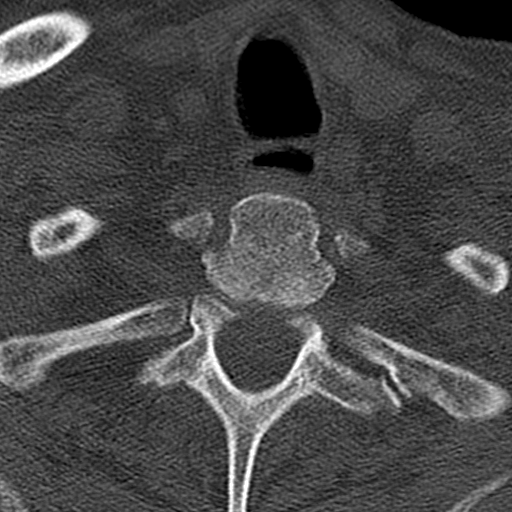
[im 29/86  bone]
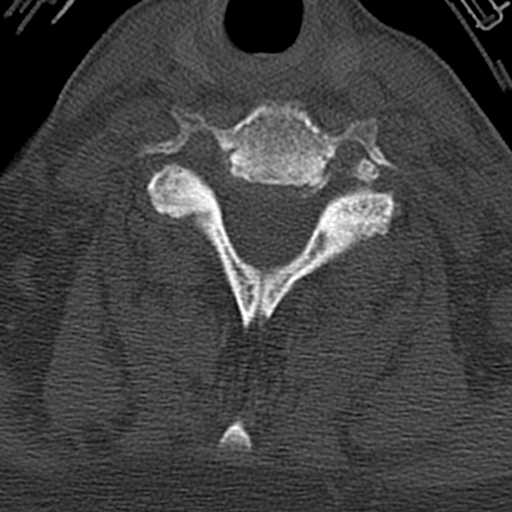
[im 43/86  bone]
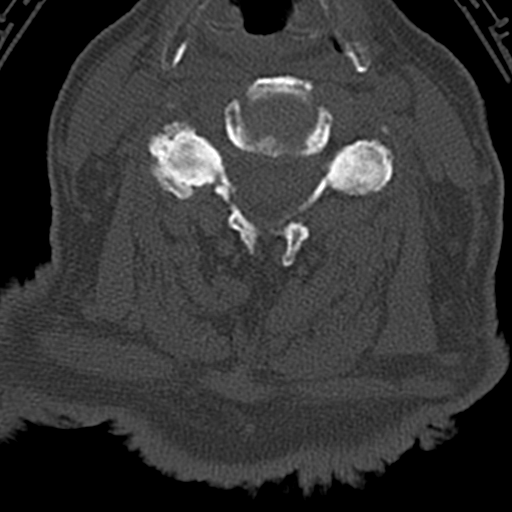
[im 57/86  bone]
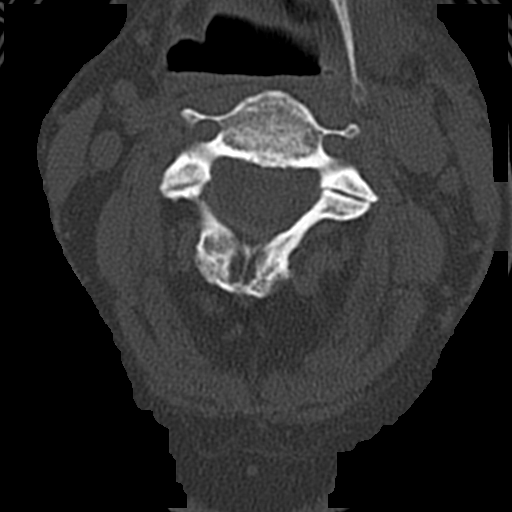
[im 71/86  soft-tissue]
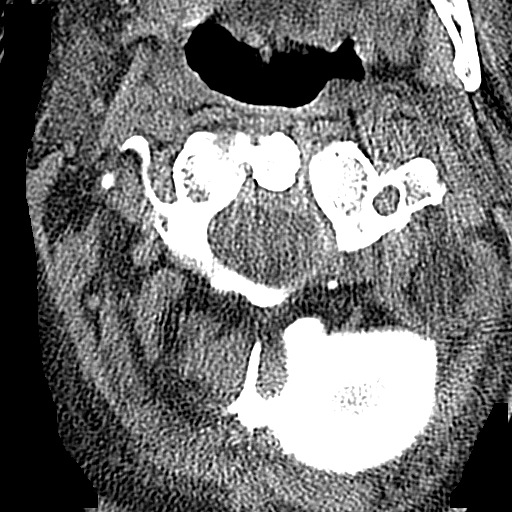
[im 71/86  bone]
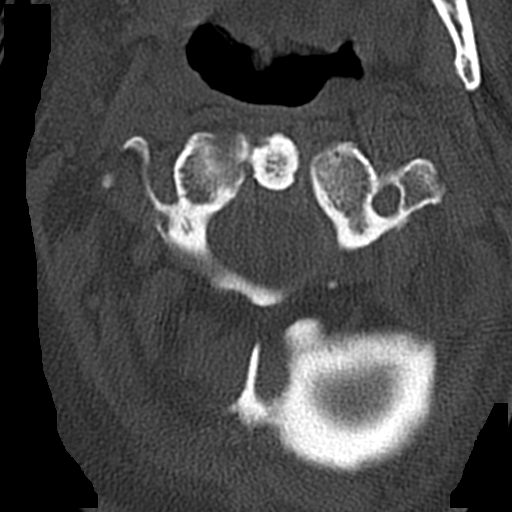

[Series 7: coronal bone · coronal · 0.26mm/px · 3 of 61 slices shown]
[im 13/61  bone]
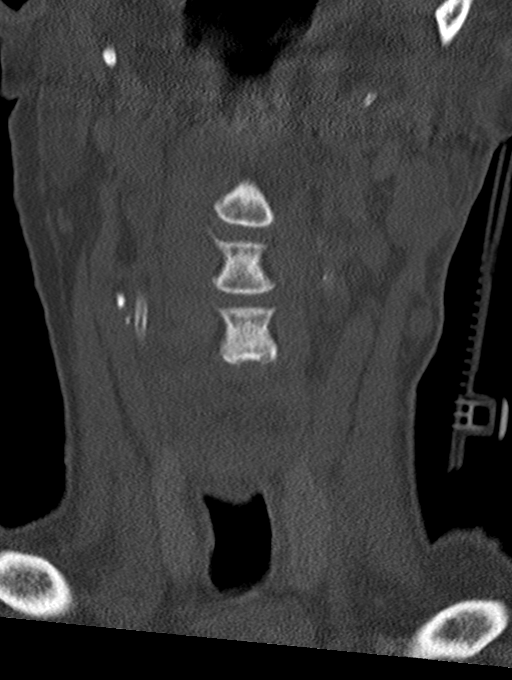
[im 25/61  bone]
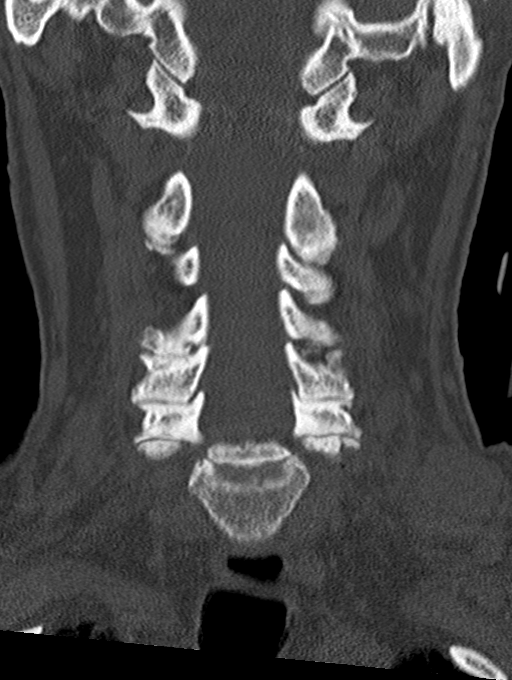
[im 37/61  bone]
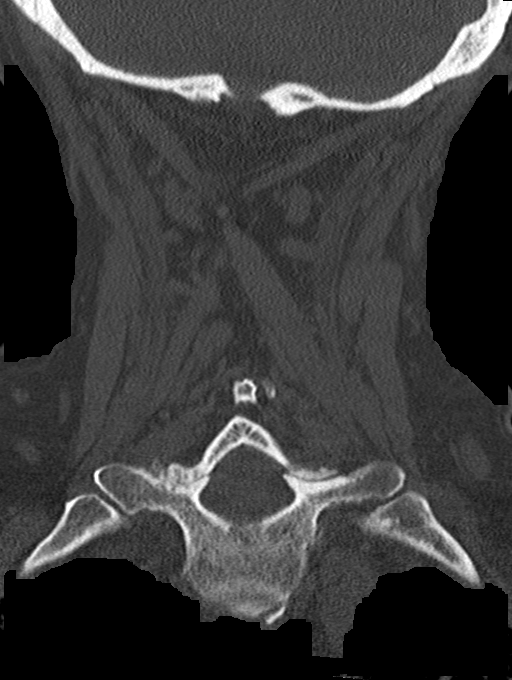

[Series 8: sagittal bone · sagittal · 0.26mm/px · 5 of 61 slices shown, 6 images]
[im 21/61  bone]
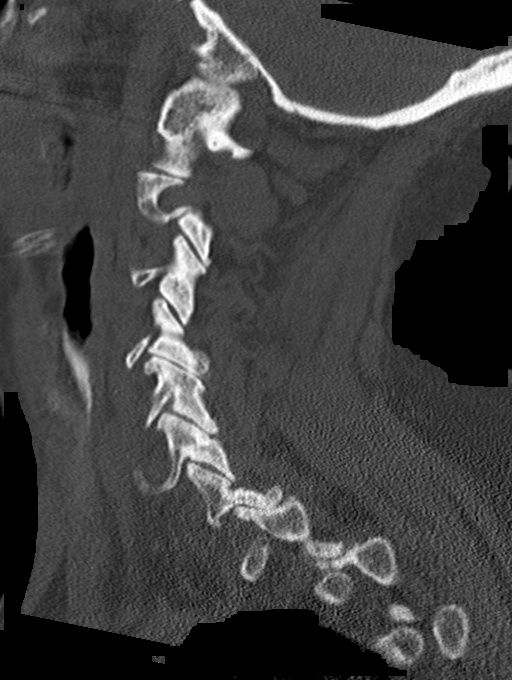
[im 26/61  bone]
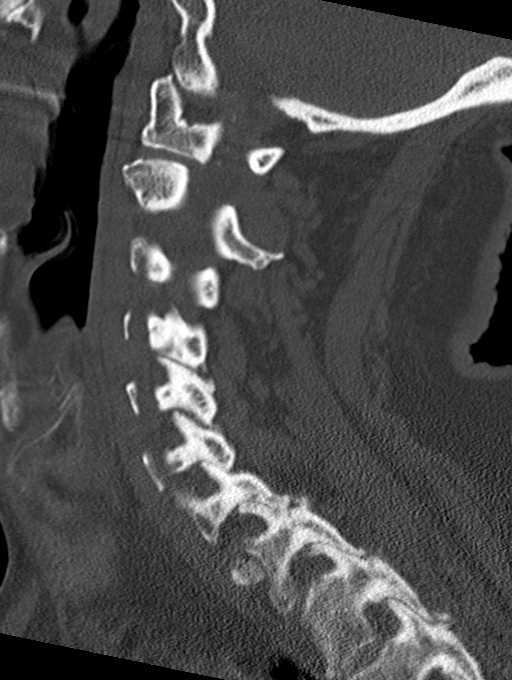
[im 31/61  soft-tissue]
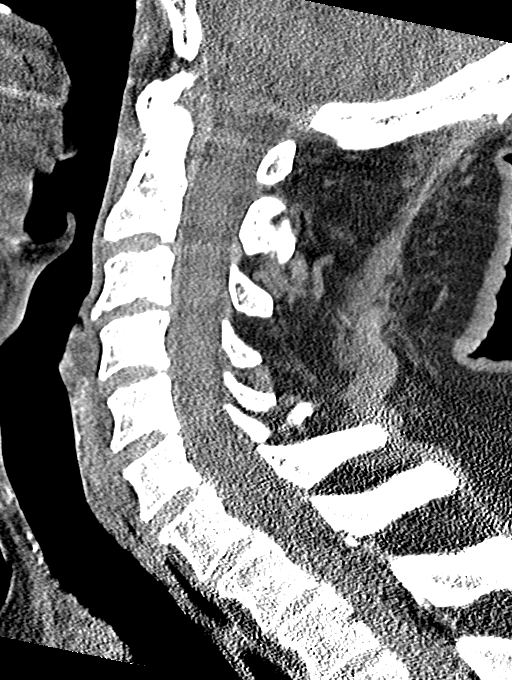
[im 31/61  bone]
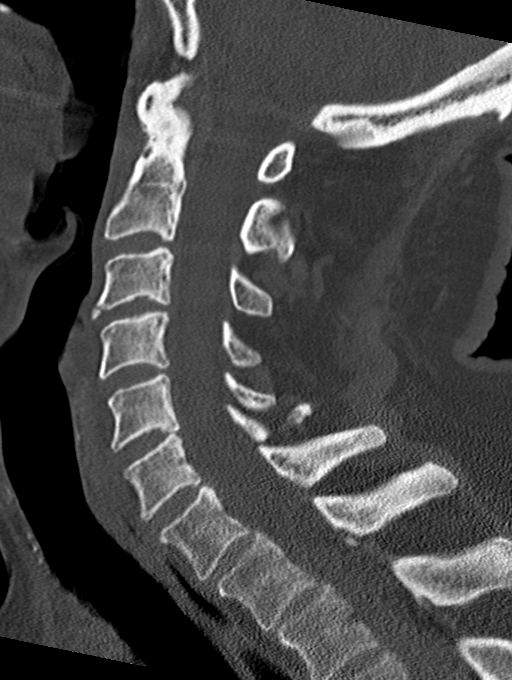
[im 36/61  bone]
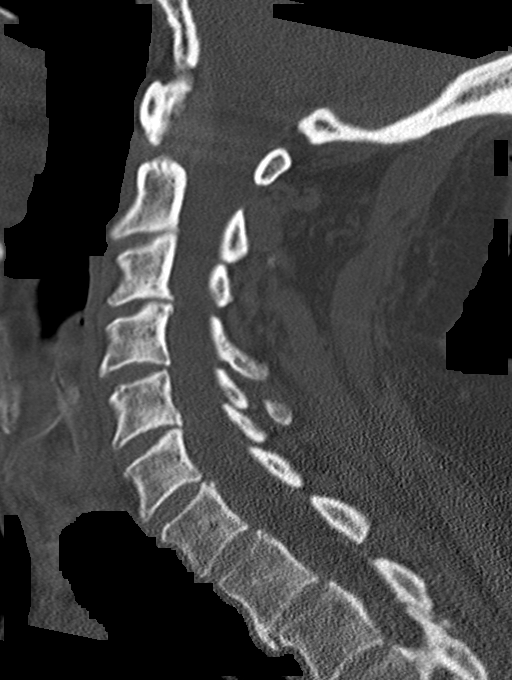
[im 41/61  bone]
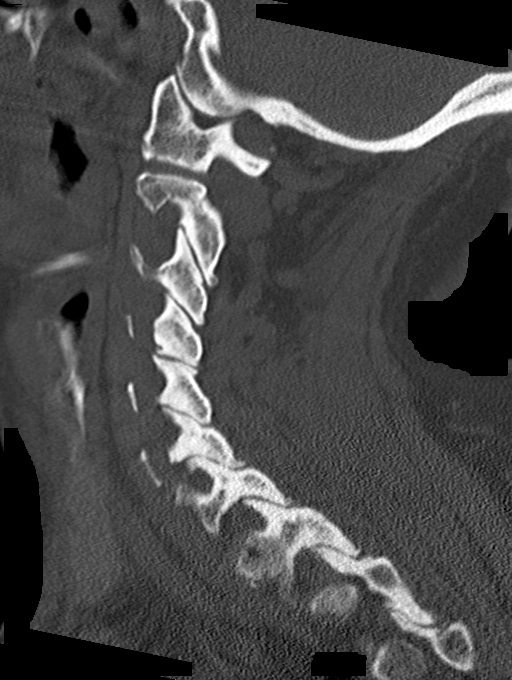

[13 of 35 positions shown; findings below may reference images not displayed]

FINDINGS: Alignment: Normal.

Skull base and vertebrae: No acute fracture. No primary bone lesion
or focal pathologic process.

Soft tissues and spinal canal: No prevertebral fluid or swelling. No
visible canal hematoma.

Disc levels:

C2-3: There is mild end plate spondylosis. Mild disc space narrowing
is seen. Bilateral facet hypertrophy is noted. Normal central canal
and intervertebral neuroforamina.

C3-4: There is mild end plate spondylosis. Mild disc space narrowing
is seen. Bilateral facet hypertrophy is noted. Normal central canal
and intervertebral neuroforamina.

C4-5: There is mild end plate spondylosis. Mild disc space narrowing
is seen. Bilateral facet hypertrophy is noted. Normal central canal
and intervertebral neuroforamina.

C5-6: There is mild end plate spondylosis. Moderate to marked
severity posterior disc space narrowing is seen. Bilateral facet
hypertrophy is noted. Normal central canal and intervertebral
neuroforamina.

C6-7: There is mild end plate spondylosis. Moderate to marked
severity posterior disc space narrowing is seen. Bilateral facet
hypertrophy is noted. Normal central canal and intervertebral
neuroforamina.

C7-T1: There is mild end plate spondylosis. Moderate to marked
severity posterior disc space narrowing is seen. Bilateral facet
hypertrophy is noted. Normal central canal and intervertebral
neuroforamina.

Upper chest: Negative.

Other: None.
IMPRESSION: 1. Multilevel degenerative changes noted in the cervical spine
without fracture.
